# Patient Record
Sex: Female | Born: 1955 | Race: White | Hispanic: No | Smoking: Current every day smoker
Health system: Southern US, Community
[De-identification: ages and names within clinical notes are randomized; demographics above are authoritative.]

## PROBLEM LIST (undated history)

## (undated) DIAGNOSIS — E079 Disorder of thyroid, unspecified: Secondary | ICD-10-CM

## (undated) DIAGNOSIS — Z6833 Body mass index (BMI) 33.0-33.9, adult: Secondary | ICD-10-CM

## (undated) DIAGNOSIS — K219 Gastro-esophageal reflux disease without esophagitis: Secondary | ICD-10-CM

## (undated) DIAGNOSIS — I10 Essential (primary) hypertension: Secondary | ICD-10-CM

## (undated) DIAGNOSIS — E785 Hyperlipidemia, unspecified: Secondary | ICD-10-CM

## (undated) DIAGNOSIS — F419 Anxiety disorder, unspecified: Secondary | ICD-10-CM

## (undated) DIAGNOSIS — F17210 Nicotine dependence, cigarettes, uncomplicated: Secondary | ICD-10-CM

## (undated) DIAGNOSIS — I700 Atherosclerosis of aorta: Secondary | ICD-10-CM

## (undated) DIAGNOSIS — R41840 Attention and concentration deficit: Secondary | ICD-10-CM

## (undated) DIAGNOSIS — E039 Hypothyroidism, unspecified: Secondary | ICD-10-CM

## (undated) DIAGNOSIS — Z6834 Body mass index (BMI) 34.0-34.9, adult: Secondary | ICD-10-CM

## (undated) DIAGNOSIS — E119 Type 2 diabetes mellitus without complications: Secondary | ICD-10-CM

## (undated) DIAGNOSIS — E559 Vitamin D deficiency, unspecified: Secondary | ICD-10-CM

## (undated) DIAGNOSIS — R4181 Age-related cognitive decline: Secondary | ICD-10-CM

## (undated) HISTORY — DX: Body mass index (BMI) 33.0-33.9, adult: Z68.33

## (undated) HISTORY — DX: Essential (primary) hypertension: I10

## (undated) HISTORY — DX: Anxiety disorder, unspecified: F41.9

## (undated) HISTORY — DX: Age-related cognitive decline: R41.81

## (undated) HISTORY — DX: Nicotine dependence, cigarettes, uncomplicated: F17.210

## (undated) HISTORY — DX: Vitamin D deficiency, unspecified: E55.9

## (undated) HISTORY — DX: Atherosclerosis of aorta: I70.0

## (undated) HISTORY — DX: Attention and concentration deficit: R41.840

## (undated) HISTORY — PX: TUBAL LIGATION: SHX77

## (undated) HISTORY — DX: Gastro-esophageal reflux disease without esophagitis: K21.9

## (undated) HISTORY — DX: Hypothyroidism, unspecified: E03.9

## (undated) HISTORY — DX: Disorder of thyroid, unspecified: E07.9

## (undated) HISTORY — PX: CARPAL TUNNEL RELEASE: SHX101

## (undated) HISTORY — DX: Body mass index (BMI) 34.0-34.9, adult: Z68.34

---

## 2001-03-11 ENCOUNTER — Other Ambulatory Visit: Admission: RE | Admit: 2001-03-11 | Discharge: 2001-03-11 | Payer: Self-pay | Admitting: *Deleted

## 2002-01-06 ENCOUNTER — Encounter: Payer: Self-pay | Admitting: Gastroenterology

## 2002-01-06 ENCOUNTER — Ambulatory Visit (HOSPITAL_COMMUNITY): Admission: RE | Admit: 2002-01-06 | Discharge: 2002-01-06 | Payer: Self-pay | Admitting: Gastroenterology

## 2003-06-17 ENCOUNTER — Ambulatory Visit (HOSPITAL_BASED_OUTPATIENT_CLINIC_OR_DEPARTMENT_OTHER): Admission: RE | Admit: 2003-06-17 | Discharge: 2003-06-17 | Payer: Self-pay | Admitting: Orthopedic Surgery

## 2003-06-17 ENCOUNTER — Ambulatory Visit (HOSPITAL_COMMUNITY): Admission: RE | Admit: 2003-06-17 | Discharge: 2003-06-17 | Payer: Self-pay | Admitting: Orthopedic Surgery

## 2003-08-07 ENCOUNTER — Ambulatory Visit (HOSPITAL_BASED_OUTPATIENT_CLINIC_OR_DEPARTMENT_OTHER): Admission: RE | Admit: 2003-08-07 | Discharge: 2003-08-07 | Payer: Self-pay | Admitting: Orthopedic Surgery

## 2006-09-21 ENCOUNTER — Emergency Department (HOSPITAL_COMMUNITY): Admission: EM | Admit: 2006-09-21 | Discharge: 2006-09-22 | Payer: Self-pay | Admitting: Emergency Medicine

## 2006-10-26 ENCOUNTER — Emergency Department (HOSPITAL_COMMUNITY): Admission: EM | Admit: 2006-10-26 | Discharge: 2006-10-27 | Payer: Self-pay | Admitting: Emergency Medicine

## 2008-08-24 IMAGING — CR DG CHEST 1V PORT
1 series · 1 of 1 positions shown · non-contrast
Comparison: None

CLINICAL DATA: Shortness of breath

PORTABLE CHEST - 1 VIEW:

[view not recorded]
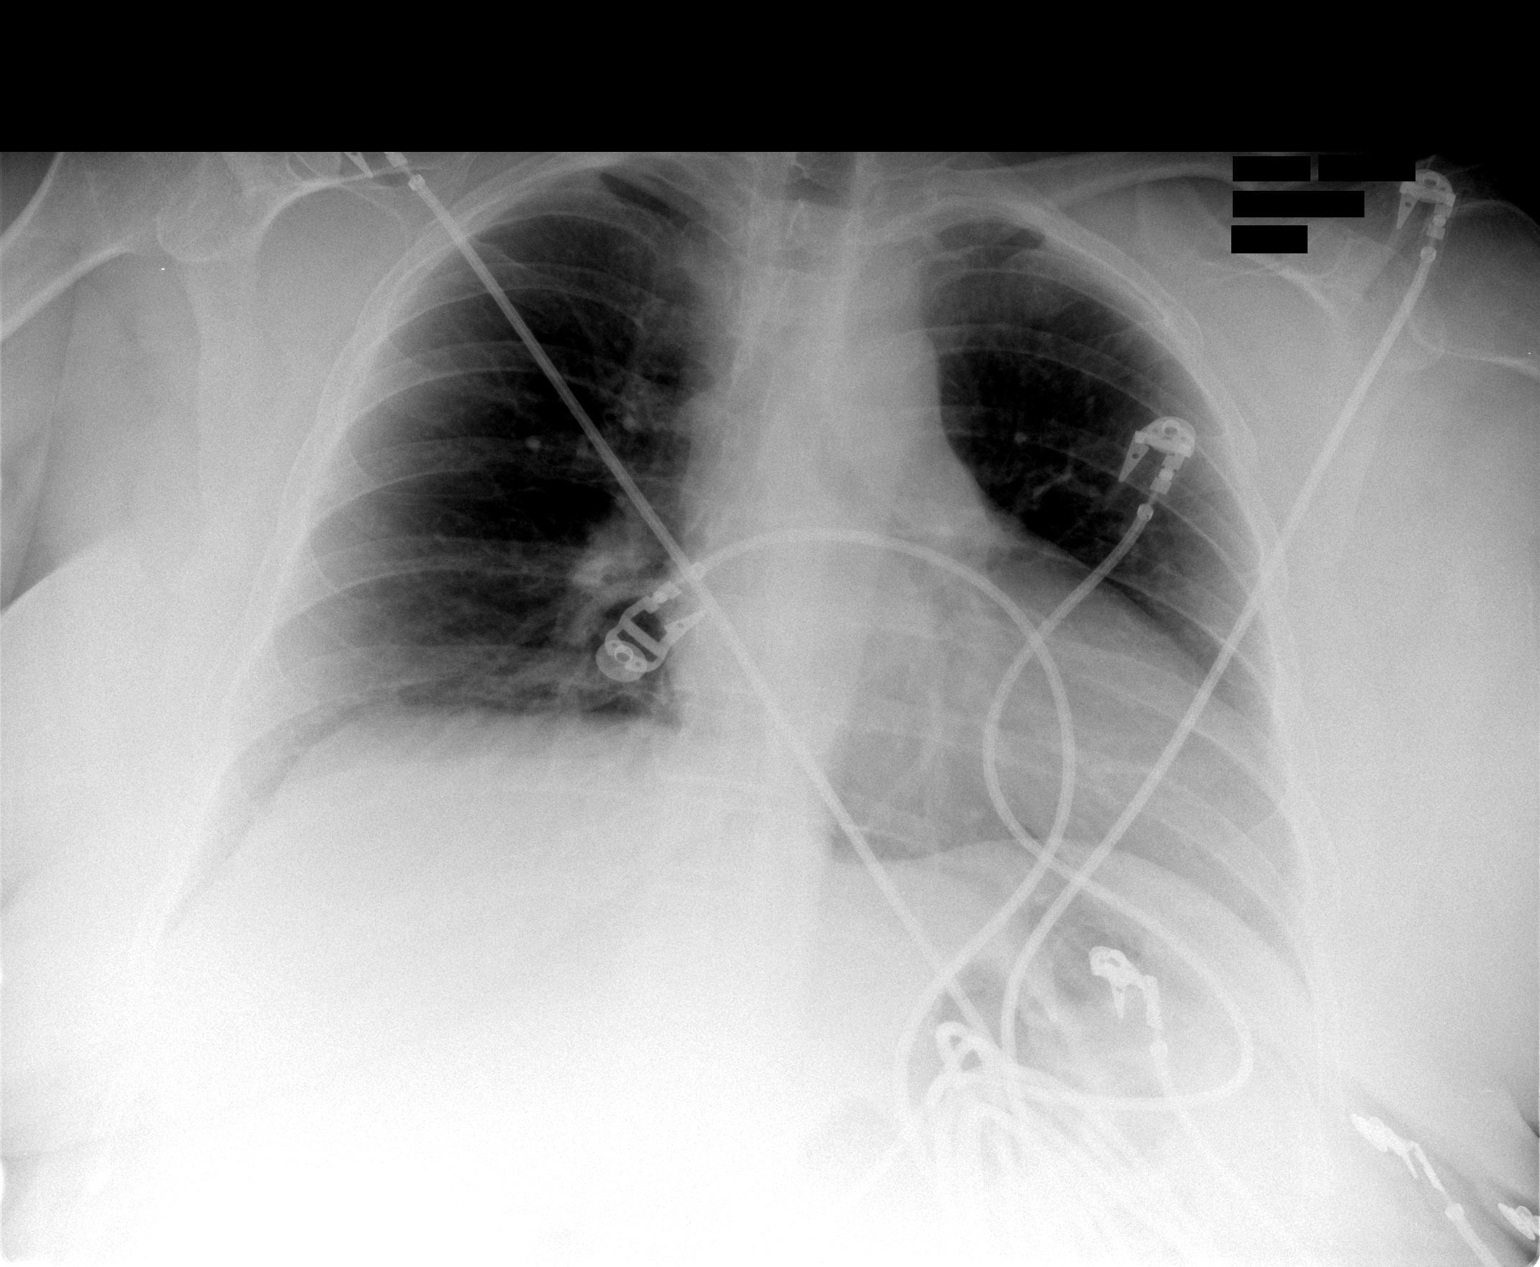

[1 of 1 positions shown; findings below may reference images not displayed]

FINDINGS: There are low lung volumes. Right base atelectasis present. Left lung
clear. Heart size is accentuated by the low volumes and portable study. No
effusions.
IMPRESSION: Low volumes, right base atelectasis.

## 2010-05-20 NOTE — Op Note (Signed)
NAMEKEALANI, LECKEY                             ACCOUNT NO.:  [id number]   MEDICAL RECORD NO.:  [id number]                   PATIENT TYPE:  AMB   LOCATION:  DSC                                  FACILITY:  [facility]   PHYSICIAN:  Cindee Salt, M.D.                    DATE OF BIRTH:  09-29-1955   DATE OF PROCEDURE:  DATE OF DISCHARGE:                                 OPERATIVE REPORT   Audio too short to transcribe (less than 5 seconds)                                               Cindee Salt, M.D.    GK/MEDQ  D:  06/17/2003  T:  06/17/2003  Job:  [id number]

## 2010-05-20 NOTE — Op Note (Signed)
NAMECELICA, [name]                             ACCOUNT NO.:  [id number]   MEDICAL RECORD NO.:  [id number]                   PATIENT TYPE:  AMB   LOCATION:  DSC                                  FACILITY:  [facility]   PHYSICIAN:  Cindee Salt, M.D.                    DATE OF BIRTH:  03-18-1955   DATE OF PROCEDURE:  06/17/2003  DATE OF DISCHARGE:                                 OPERATIVE REPORT   PREOPERATIVE DIAGNOSIS:  Carpal tunnel syndrome, left hand.   POSTOPERATIVE DIAGNOSIS:  Carpal tunnel syndrome, left hand.   OPERATION:  Decompression, left median nerve.   SURGEON:  Cindee Salt, M.D.   ASSISTANTCarolyne Fiscal.   ANESTHESIA:  Forearm-based IV regional.   HISTORY:  The patient is a 55 year old female with a history of carpal  tunnel syndrome, EMG nerve conductions positive, which has not responded to  conservative treatment.   PROCEDURE:  The patient was brought to the operating room, where a forearm-  based IV regional anesthetic was carried out without difficulty.  She was  prepped using Duraprep in supine position, left arm free.  The area was  infiltrated with 0.25% Marcaine without epinephrine and a straight incision  was made longitudinally in the palm and carried down through subcutaneous  tissue.  Bleeders were electrocauterized.  The palmar fascia was split,  superficial palmar arch identified, the flexor tendon to the right and  little finger identified to the ulnar side of the median nerve.  The carpal  retinaculum was incised with sharp dissection.  A right angle and Sewell  retractor were placed between skin and forearm fascia.  The fascia was  released for approximately a centimeter proximal to the wrist crease under  direct vision.  No further lesions were identified.  The wound was  irrigated.  Tenosynovial tissue was moderately thickened.  The skin was  closed with interrupted 5-0 nylon suture and a sterile compressive dressing  and splint to the hand was applied.   The patient tolerated the procedure  well and was taken to the recovery room for observation in satisfactory  condition.  She is discharged home to return to the [facility]  [place] in one week on Vicodin.                                               Cindee Salt, M.D.    GK/MEDQ  D:  06/17/2003  T:  06/17/2003  Job:  [id number]

## 2010-05-20 NOTE — Op Note (Signed)
[name], Alyssa [name]                             ACCOUNT NO.:  [id number]   MEDICAL RECORD NO.:  [id number]                   PATIENT TYPE:  AMB   LOCATION:  DSC                                  FACILITY:  [facility]   PHYSICIAN:  Cindee Salt, M.D.                    DATE OF BIRTH:  February 24, 1955   DATE OF PROCEDURE:  DATE OF DISCHARGE:                                 OPERATIVE REPORT   DATE OF OPERATION:  August 07, 2003.   PREOPERATIVE DIAGNOSIS:  Carpal tunnel syndrome, right hand.   POSTOPERATIVE DIAGNOSIS:  Carpal tunnel syndrome, right hand.   OPERATION:  Release, right carpal tunnel.   SURGEON:  Cindee Salt, MD.   Threasa HeadsCarolyne Fiscal.   ANESTHESIA:  Forearm based IV regional.   HISTORY:  The patient is a 55 year old female with a history of carpal  tunnel syndrome.  EMG nerve conduction is positive, which has not responded  to conservative treatment.  She has undergone release on her left side with  good results.  She is admitted now for release to her right side.   PROCEDURE:  The patient was brought to the operating room where a forearm  based IV regional anesthetic was carried out without difficulty.  She was  prepped using Duraprep.  In the supine position, right arm free, a  longitudinal incision was made in the palm and carried down through  subcutaneous tissues.  Bleeders were electrocauterized.  The palmar fascia  was split, the superficial palmar arch identified, the flexor tendon to the  ring and little finger identified.  To the ulnar side of the median nerve,  the carpal retinaculum was incised with sharp dissection and a right angle  and Sewell retractor were placed between skin and forearm fascia.  The  fascia was released for approximately 1.5 to 2 cm proximal to the wrist  crease under direct vision.  The canal was explored, tenosynovial tissue was  thickened, no further lesions were identified.  The wound was irrigated, and  the skin was closed with interrupted 5-0  nylon suture.  The sterile,  compressive dressing and splint were applied.  The patient tolerated the  procedure well and was taken to the recovery room for observation in  satisfactory condition.  She is discharged home to return to the [facility] in one week on Vicodin.                                               Cindee Salt, M.D.    Angelique Blonder  D:  08/07/2003  T:  08/08/2003  Job:  [id number]

## 2010-10-12 LAB — BASIC METABOLIC PANEL
BUN: 14
CO2: 25
Calcium: 9.6
Chloride: 104
Creatinine, Ser: 0.76
GFR calc Af Amer: >60
GFR calc non Af Amer: >60
Glucose, Bld: 121 — ABNORMAL HIGH
Potassium: 3.9
Sodium: 138

## 2010-10-12 LAB — DIFFERENTIAL
Basophils Absolute: 0.1
Basophils Relative: 1
Eosinophils Absolute: 0.3
Eosinophils Relative: 3
Lymphocytes Relative: 25
Lymphs Abs: 3
Monocytes Absolute: 1 — ABNORMAL HIGH
Monocytes Relative: 9
Neutro Abs: 7.7
Neutrophils Relative %: 63

## 2010-10-12 LAB — CBC
HCT: 39.1
Hemoglobin: 13.4
MCHC: 34.2
MCV: 91
Platelets: 546 — ABNORMAL HIGH
RBC: 4.3
RDW: 13.4
WBC: 12.1 — ABNORMAL HIGH

## 2010-10-12 LAB — D-DIMER, QUANTITATIVE: D-Dimer, Quant: 0.37

## 2010-10-13 LAB — I-STAT 8, (EC8 V) (CONVERTED LAB)
Acid-base deficit: 1
Bicarbonate: 23.2
Chloride: 105
HCT: 44
Operator id: 133351
pCO2, Ven: 34.9 — ABNORMAL LOW
pH, Ven: 7.431 — ABNORMAL HIGH

## 2010-10-13 LAB — POCT I-STAT CREATININE: Creatinine, Ser: 0.6

## 2010-10-13 LAB — POCT CARDIAC MARKERS
CKMB, poc: 2.8
Myoglobin, poc: 170
Troponin i, poc: <0.05

## 2011-03-15 ENCOUNTER — Other Ambulatory Visit (HOSPITAL_COMMUNITY): Payer: Self-pay | Admitting: Physician Assistant

## 2011-03-15 DIAGNOSIS — Z1231 Encounter for screening mammogram for malignant neoplasm of breast: Secondary | ICD-10-CM

## 2011-03-20 ENCOUNTER — Ambulatory Visit (HOSPITAL_COMMUNITY)
Admission: RE | Admit: 2011-03-20 | Discharge: 2011-03-20 | Disposition: A | Discharge status: Home / Self Care | Payer: Self-pay | Source: Ambulatory Visit | Attending: Physician Assistant | Admitting: Physician Assistant

## 2011-03-20 DIAGNOSIS — Z1231 Encounter for screening mammogram for malignant neoplasm of breast: Secondary | ICD-10-CM

## 2011-03-24 ENCOUNTER — Other Ambulatory Visit: Payer: Self-pay | Admitting: Physician Assistant

## 2011-03-24 DIAGNOSIS — R928 Other abnormal and inconclusive findings on diagnostic imaging of breast: Secondary | ICD-10-CM

## 2011-04-04 ENCOUNTER — Other Ambulatory Visit (HOSPITAL_COMMUNITY): Payer: Self-pay | Admitting: Family Medicine

## 2011-04-04 DIAGNOSIS — R928 Other abnormal and inconclusive findings on diagnostic imaging of breast: Secondary | ICD-10-CM

## 2011-04-12 ENCOUNTER — Other Ambulatory Visit (HOSPITAL_COMMUNITY): Payer: Self-pay | Admitting: Family Medicine

## 2011-04-12 ENCOUNTER — Ambulatory Visit (HOSPITAL_COMMUNITY)
Admission: RE | Admit: 2011-04-12 | Discharge: 2011-04-12 | Disposition: A | Discharge status: Home / Self Care | Payer: PRIVATE HEALTH INSURANCE | Source: Ambulatory Visit | Attending: Physician Assistant | Admitting: Physician Assistant

## 2011-04-12 ENCOUNTER — Ambulatory Visit (HOSPITAL_COMMUNITY)
Admission: RE | Admit: 2011-04-12 | Discharge: 2011-04-12 | Disposition: A | Discharge status: Home / Self Care | Payer: PRIVATE HEALTH INSURANCE | Source: Ambulatory Visit | Attending: Family Medicine | Admitting: Family Medicine

## 2011-04-12 DIAGNOSIS — R928 Other abnormal and inconclusive findings on diagnostic imaging of breast: Secondary | ICD-10-CM

## 2011-04-12 DIAGNOSIS — N6459 Other signs and symptoms in breast: Secondary | ICD-10-CM | POA: Insufficient documentation

## 2012-05-30 ENCOUNTER — Other Ambulatory Visit (HOSPITAL_COMMUNITY): Payer: Self-pay | Admitting: Physician Assistant

## 2012-05-30 DIAGNOSIS — Z139 Encounter for screening, unspecified: Secondary | ICD-10-CM

## 2012-07-30 ENCOUNTER — Other Ambulatory Visit (HOSPITAL_COMMUNITY): Payer: Self-pay | Admitting: Nurse Practitioner

## 2012-07-30 DIAGNOSIS — N6452 Nipple discharge: Secondary | ICD-10-CM

## 2012-08-07 ENCOUNTER — Other Ambulatory Visit (HOSPITAL_COMMUNITY): Payer: Self-pay | Admitting: Nurse Practitioner

## 2012-08-07 ENCOUNTER — Ambulatory Visit (HOSPITAL_COMMUNITY)
Admission: RE | Admit: 2012-08-07 | Discharge: 2012-08-07 | Disposition: A | Discharge status: Home / Self Care | Payer: PRIVATE HEALTH INSURANCE | Source: Ambulatory Visit | Attending: Nurse Practitioner | Admitting: Nurse Practitioner

## 2012-08-07 DIAGNOSIS — N6452 Nipple discharge: Secondary | ICD-10-CM

## 2012-08-07 DIAGNOSIS — Z1231 Encounter for screening mammogram for malignant neoplasm of breast: Secondary | ICD-10-CM | POA: Insufficient documentation

## 2013-12-11 ENCOUNTER — Encounter (HOSPITAL_COMMUNITY): Payer: Self-pay | Admitting: Emergency Medicine

## 2013-12-11 ENCOUNTER — Emergency Department (HOSPITAL_COMMUNITY): Payer: Self-pay

## 2013-12-11 ENCOUNTER — Emergency Department (HOSPITAL_COMMUNITY)
Admission: EM | Admit: 2013-12-11 | Discharge: 2013-12-11 | Disposition: A | Discharge status: Home / Self Care | Payer: Self-pay | Attending: Emergency Medicine | Admitting: Emergency Medicine

## 2013-12-11 DIAGNOSIS — K5732 Diverticulitis of large intestine without perforation or abscess without bleeding: Secondary | ICD-10-CM | POA: Insufficient documentation

## 2013-12-11 DIAGNOSIS — E119 Type 2 diabetes mellitus without complications: Secondary | ICD-10-CM | POA: Insufficient documentation

## 2013-12-11 DIAGNOSIS — Z720 Tobacco use: Secondary | ICD-10-CM | POA: Insufficient documentation

## 2013-12-11 DIAGNOSIS — Z79899 Other long term (current) drug therapy: Secondary | ICD-10-CM | POA: Insufficient documentation

## 2013-12-11 DIAGNOSIS — R102 Pelvic and perineal pain: Secondary | ICD-10-CM

## 2013-12-11 DIAGNOSIS — I10 Essential (primary) hypertension: Secondary | ICD-10-CM | POA: Insufficient documentation

## 2013-12-11 DIAGNOSIS — R1032 Left lower quadrant pain: Secondary | ICD-10-CM | POA: Insufficient documentation

## 2013-12-11 DIAGNOSIS — Z792 Long term (current) use of antibiotics: Secondary | ICD-10-CM | POA: Insufficient documentation

## 2013-12-11 DIAGNOSIS — R0602 Shortness of breath: Secondary | ICD-10-CM

## 2013-12-11 HISTORY — DX: Essential (primary) hypertension: I10

## 2013-12-11 HISTORY — DX: Type 2 diabetes mellitus without complications: E11.9

## 2013-12-11 LAB — COMPREHENSIVE METABOLIC PANEL
ALBUMIN: 3.4 g/dL — AB (ref 3.5–5.2)
ALK PHOS: 84 U/L (ref 39–117)
ALT: 25 U/L (ref 0–35)
AST: 22 U/L (ref 0–37)
Anion gap: 16 — ABNORMAL HIGH (ref 5–15)
BILIRUBIN TOTAL: 0.4 mg/dL (ref 0.3–1.2)
BUN: 13 mg/dL (ref 6–23)
CHLORIDE: 99 meq/L (ref 96–112)
CO2: 23 mEq/L (ref 19–32)
Calcium: 9.6 mg/dL (ref 8.4–10.5)
Creatinine, Ser: 0.82 mg/dL (ref 0.50–1.10)
GFR calc Af Amer: >90 mL/min (ref >90)
GFR calc non Af Amer: 78 mL/min — ABNORMAL LOW (ref >90)
Glucose, Bld: 97 mg/dL (ref 70–99)
POTASSIUM: 3.8 meq/L (ref 3.7–5.3)
Sodium: 138 mEq/L (ref 137–147)
Total Protein: 8 g/dL (ref 6.0–8.3)

## 2013-12-11 LAB — CBC WITH DIFFERENTIAL/PLATELET
BASOS ABS: 0 10*3/uL (ref 0.0–0.1)
BASOS PCT: 0 % (ref 0–1)
Eosinophils Absolute: 0.2 10*3/uL (ref 0.0–0.7)
Eosinophils Relative: 2 % (ref 0–5)
HCT: 34.3 % — ABNORMAL LOW (ref 36.0–46.0)
HEMOGLOBIN: 11.7 g/dL — AB (ref 12.0–15.0)
Lymphocytes Relative: 21 % (ref 12–46)
Lymphs Abs: 2.3 10*3/uL (ref 0.7–4.0)
MCH: 32.3 pg (ref 26.0–34.0)
MCHC: 34.1 g/dL (ref 30.0–36.0)
MCV: 94.8 fL (ref 78.0–100.0)
MONOS PCT: 9 % (ref 3–12)
Monocytes Absolute: 1 10*3/uL (ref 0.1–1.0)
NEUTROS ABS: 7.4 10*3/uL (ref 1.7–7.7)
NEUTROS PCT: 68 % (ref 43–77)
Platelets: 383 10*3/uL (ref 150–400)
RBC: 3.62 MIL/uL — ABNORMAL LOW (ref 3.87–5.11)
RDW: 12.1 % (ref 11.5–15.5)
WBC: 10.9 10*3/uL — ABNORMAL HIGH (ref 4.0–10.5)

## 2013-12-11 LAB — URINALYSIS, ROUTINE W REFLEX MICROSCOPIC
Bilirubin Urine: NEGATIVE
GLUCOSE, UA: NEGATIVE mg/dL
HGB URINE DIPSTICK: NEGATIVE
Ketones, ur: NEGATIVE mg/dL
Leukocytes, UA: NEGATIVE
Nitrite: NEGATIVE
Protein, ur: NEGATIVE mg/dL
SPECIFIC GRAVITY, URINE: 1.015 (ref 1.005–1.030)
Urobilinogen, UA: 0.2 mg/dL (ref 0.0–1.0)
pH: 8 (ref 5.0–8.0)

## 2013-12-11 LAB — TROPONIN I: Troponin I: <0.3 ng/mL (ref <0.30)

## 2013-12-11 LAB — LIPASE, BLOOD: Lipase: 36 U/L (ref 11–59)

## 2013-12-11 MED ORDER — METRONIDAZOLE IN NACL 5-0.79 MG/ML-% IV SOLN
500.0000 mg | Freq: Once | INTRAVENOUS | Status: AC
  Administered 2013-12-11: 500 mg via INTRAVENOUS
  Filled 2013-12-11: qty 100

## 2013-12-11 MED ORDER — IOHEXOL 300 MG/ML  SOLN
100.0000 mL | Freq: Once | INTRAMUSCULAR | Status: AC | PRN
  Administered 2013-12-11: 100 mL via INTRAVENOUS

## 2013-12-11 MED ORDER — CIPROFLOXACIN HCL 500 MG PO TABS
500.0000 mg | ORAL_TABLET | Freq: Two times a day (BID) | ORAL | Status: DC
Start: 2013-12-11 — End: 2014-07-08

## 2013-12-11 MED ORDER — METRONIDAZOLE 500 MG PO TABS: 500.0000 mg | ORAL_TABLET | Freq: Three times a day (TID) | ORAL | Status: DC

## 2013-12-11 MED ORDER — PROMETHAZINE HCL 25 MG PO TABS: 25.0000 mg | ORAL_TABLET | Freq: Four times a day (QID) | ORAL | Status: DC | PRN

## 2013-12-11 MED ORDER — IOHEXOL 300 MG/ML  SOLN
50.0000 mL | Freq: Once | INTRAMUSCULAR | Status: AC | PRN
  Administered 2013-12-11: 50 mL via ORAL

## 2013-12-11 MED ORDER — ONDANSETRON HCL 4 MG/2ML IJ SOLN
4.0000 mg | Freq: Once | INTRAMUSCULAR | Status: AC
  Administered 2013-12-11: 4 mg via INTRAVENOUS
  Filled 2013-12-11: qty 2

## 2013-12-11 MED ORDER — MORPHINE SULFATE 4 MG/ML IJ SOLN
4.0000 mg | Freq: Once | INTRAMUSCULAR | Status: AC
  Administered 2013-12-11: 4 mg via INTRAVENOUS
  Filled 2013-12-11: qty 1

## 2013-12-11 MED ORDER — HYDROCODONE-ACETAMINOPHEN 5-325 MG PO TABS: 2.0000 | ORAL_TABLET | ORAL | Status: DC | PRN

## 2013-12-11 MED ORDER — SODIUM CHLORIDE 0.9 % IV BOLUS (SEPSIS)
1000.0000 mL | Freq: Once | INTRAVENOUS | Status: AC
  Administered 2013-12-11: 1000 mL via INTRAVENOUS

## 2013-12-11 MED ORDER — CIPROFLOXACIN IN D5W 400 MG/200ML IV SOLN
400.0000 mg | Freq: Once | INTRAVENOUS | Status: AC
  Administered 2013-12-11: 400 mg via INTRAVENOUS
  Filled 2013-12-11: qty 200

## 2013-12-11 NOTE — Discharge Instructions (Signed)
You have diverticulitis of the lower intestine called the sigmoid area. Prescriptions for 2 antibiotics, pain medicine, nausea medicine. Increase fluids. Return if worse

## 2013-12-11 NOTE — ED Notes (Signed)
Pt c/o left side abd pain x 4 days with sob today. Pt brought to ED by EMS for free clinic.

## 2013-12-11 NOTE — Care Management Note (Signed)
ED/CM noted patient did not have health insurance and/or PCP listed in the computer.  Patient was given the [place] resource handout with information on the clinics, food pantries, and the handout for new health insurance sign-up. Pt was also given a Rx discount card. Patient expressed appreciation for information received. 

## 2013-12-13 NOTE — ED Provider Notes (Signed)
CSN: [id number]     Arrival date & time 12/11/13  1108 History   First MD Initiated Contact with Patient 12/11/13 1215     Chief Complaint  Patient presents with  . Shortness of Breath     (Consider location/radiation/quality/duration/timing/severity/associated sxs/prior Treatment) HPI.... Left-sided abdominal pain for 4 days. Patient is able to eat. No fevers, sweats, chills, diarrhea, dysuria.. No radiation of pain. This pain is unusual for patient. Past medical history includes diabetes mellitus hypertension. Palpation makes pain worse.  Past Medical History  Diagnosis Date  . Diabetes mellitus without complication   . Hypertension    Past Surgical History  Procedure Laterality Date  . Carpal tunnel release     No family history on file. History  Substance Use Topics  . Smoking status: Current Every Day Smoker -- 0.50 packs/day    Types: Cigarettes  . Smokeless tobacco: Not on file  . Alcohol Use: No   OB History    No data available     Review of Systems  All other systems reviewed and are negative.     Allergies  Sulfa antibiotics  Home Medications   Prior to Admission medications   Medication Sig Start Date End Date Taking? Authorizing Provider  levothyroxine (SYNTHROID, LEVOTHROID) 125 MCG tablet Take 125 mcg by mouth daily before breakfast.   Yes Historical Provider, MD  lisinopril-hydrochlorothiazide (PRINZIDE,ZESTORETIC) 20-25 MG per tablet Take 1 tablet by mouth daily.   Yes Historical Provider, MD  metFORMIN (GLUCOPHAGE) 500 MG tablet Take 250 mg by mouth daily with breakfast.   Yes Historical Provider, MD  pantoprazole (PROTONIX) 40 MG tablet Take 40 mg by mouth daily.   Yes Historical Provider, MD  ciprofloxacin (CIPRO) 500 MG tablet Take 1 tablet (500 mg total) by mouth 2 (two) times daily. 12/11/13   Donnetta HutchingBrian Baeleigh Devincent, MD  HYDROcodone-acetaminophen (NORCO) 5-325 MG per tablet Take 2 tablets by mouth every 4 (four) hours as needed. 12/11/13   Donnetta HutchingBrian Mikaylah Libbey, MD   metroNIDAZOLE (FLAGYL) 500 MG tablet Take 1 tablet (500 mg total) by mouth 3 (three) times daily. 12/11/13   Donnetta HutchingBrian Odyssey Vasbinder, MD  promethazine (PHENERGAN) 25 MG tablet Take 1 tablet (25 mg total) by mouth every 6 (six) hours as needed. 12/11/13   Donnetta HutchingBrian Giavonna Pflum, MD   BP 97/55 mmHg  Pulse 71  Temp(Src) 97.6 F (36.4 C) (Oral)  Resp 16  Ht 5\' 3"  (1.6 m)  Wt 219 lb (99.338 kg)  BMI 38.80 kg/m2  SpO2 98% Physical Exam  Constitutional: She is oriented to person, place, and time. She appears well-developed and well-nourished.  HENT:  Head: Normocephalic and atraumatic.  Eyes: Conjunctivae and EOM are normal. Pupils are equal, round, and reactive to light.  Neck: Normal range of motion. Neck supple.  Cardiovascular: Normal rate, regular rhythm and normal heart sounds.   Pulmonary/Chest: Effort normal and breath sounds normal.  Abdominal: Soft. Bowel sounds are normal.  Tender left lower quadrant.  Musculoskeletal: Normal range of motion.  Neurological: She is alert and oriented to person, place, and time.  Skin: Skin is warm and dry.  Psychiatric: She has a normal mood and affect. Her behavior is normal.  Nursing note and vitals reviewed.   ED Course  Procedures (including critical care time) Labs Review Labs Reviewed  CBC WITH DIFFERENTIAL - Abnormal; Notable for the following:    WBC 10.9 (*)    RBC 3.62 (*)    Hemoglobin 11.7 (*)    HCT 34.3 (*)  All other components within normal limits  COMPREHENSIVE METABOLIC PANEL - Abnormal; Notable for the following:    Albumin 3.4 (*)    GFR calc non Af Amer 78 (*)    Anion gap 16 (*)    All other components within normal limits  TROPONIN I  LIPASE, BLOOD  URINALYSIS, ROUTINE W REFLEX MICROSCOPIC    Imaging Review Ct Abdomen Pelvis W Contrast  12/11/2013   CLINICAL DATA:  Suprapubic and left-sided abdomen pain for 4 days.  EXAM: CT ABDOMEN AND PELVIS WITH CONTRAST  TECHNIQUE: Multidetector CT imaging of the abdomen and pelvis was  performed using the standard protocol following bolus administration of intravenous contrast.  CONTRAST:  100mL OMNIPAQUE IOHEXOL 300 MG/ML SOLN, 50mL OMNIPAQUE IOHEXOL 300 MG/ML SOLN  COMPARISON:  None.  FINDINGS: The liver, spleen, pancreas and adrenal glands are normal. There is gallstone in the gallbladder. There is no pericolonic inflammatory change. There is a 3.1 x 3.3 cm simple cyst in the medial lower pole right kidney. The kidneys are otherwise normal. There is no hydronephrosis bilaterally. There is atherosclerosis of the abdominal aorta without aneurysmal dilatation. There is no abdominal lymphadenopathy.  There is bowel wall thickening with surrounding inflammation surrounding the sigmoid colon. There is no focal abscess or free air. There is no colonic or small bowel obstruction. The appendix is not seen but no inflammation surrounds the cecum.  Images of the pelvis demonstrate fluid-filled bladder without abnormality. The uterus is normal. Pelvic phleboliths are identified. The visualized lung bases are clear. Degenerative joint changes of the spine are noted.  IMPRESSION: Findings consistent with sigmoid diverticulitis. No focal discrete abscess is identified.   Electronically Signed   By: Sherian ReinWei-Chen  Lin M.D.   On: 12/11/2013 16:16     EKG Interpretation None      MDM   Final diagnoses:  SOB (shortness of breath)  Suprapubic pain  Sigmoid diverticulitis    CT abdomen/pelvis suggests sigmoid diverticulitis. IV Flagyl, IV Cipro. Patient is hemodynamically stable. I believe she can be treated as an outpatient. Discharge medications include Flagyl 500 mg, Cipro 500 mg, Vicodin, Phenergan 25 mg. She'll return if worse.    Donnetta HutchingBrian Kayte Borchard, MD 12/13/13 1150

## 2014-01-12 ENCOUNTER — Other Ambulatory Visit (HOSPITAL_COMMUNITY): Payer: Self-pay | Admitting: Physician Assistant

## 2014-01-12 DIAGNOSIS — Z1231 Encounter for screening mammogram for malignant neoplasm of breast: Secondary | ICD-10-CM

## 2014-01-28 ENCOUNTER — Ambulatory Visit (HOSPITAL_COMMUNITY): Payer: Self-pay

## 2014-01-29 ENCOUNTER — Ambulatory Visit (HOSPITAL_COMMUNITY)
Admission: RE | Admit: 2014-01-29 | Discharge: 2014-01-29 | Disposition: A | Discharge status: Home / Self Care | Payer: Self-pay | Source: Ambulatory Visit | Attending: Physician Assistant | Admitting: Physician Assistant

## 2014-01-29 DIAGNOSIS — Z1231 Encounter for screening mammogram for malignant neoplasm of breast: Secondary | ICD-10-CM

## 2014-01-30 ENCOUNTER — Other Ambulatory Visit: Payer: Self-pay | Admitting: Physician Assistant

## 2014-01-30 DIAGNOSIS — R928 Other abnormal and inconclusive findings on diagnostic imaging of breast: Secondary | ICD-10-CM

## 2014-02-17 ENCOUNTER — Encounter (HOSPITAL_COMMUNITY): Payer: Self-pay

## 2014-04-14 ENCOUNTER — Telehealth (HOSPITAL_COMMUNITY): Payer: Self-pay | Admitting: *Deleted

## 2014-04-14 NOTE — Telephone Encounter (Signed)
Telephoned patient at home # and is disconnected.

## 2014-04-20 ENCOUNTER — Other Ambulatory Visit (HOSPITAL_COMMUNITY): Payer: Self-pay | Admitting: *Deleted

## 2014-04-20 DIAGNOSIS — R928 Other abnormal and inconclusive findings on diagnostic imaging of breast: Secondary | ICD-10-CM

## 2014-05-19 ENCOUNTER — Ambulatory Visit (HOSPITAL_COMMUNITY)
Admission: RE | Admit: 2014-05-19 | Discharge: 2014-05-19 | Disposition: A | Discharge status: Home / Self Care | Payer: Self-pay | Source: Ambulatory Visit | Attending: Obstetrics and Gynecology | Admitting: Obstetrics and Gynecology

## 2014-05-19 ENCOUNTER — Encounter (HOSPITAL_COMMUNITY): Payer: Self-pay

## 2014-05-19 VITALS — BP 124/76 | Temp 97.9°F | Ht 63.0 in | Wt 224.6 lb

## 2014-05-19 DIAGNOSIS — R928 Other abnormal and inconclusive findings on diagnostic imaging of breast: Secondary | ICD-10-CM

## 2014-05-19 DIAGNOSIS — Z01419 Encounter for gynecological examination (general) (routine) without abnormal findings: Secondary | ICD-10-CM

## 2014-05-19 HISTORY — DX: Hyperlipidemia, unspecified: E78.5

## 2014-05-19 NOTE — Patient Instructions (Signed)
Educational materials on self breast awareness given. Explained to Alyssa [name] that based on her last Pap smear being abnormal that she will need a Pap smear in 1 year if today's Pap smear is normal. Referred patient to [facility] for a left breast diagnostic mammogram per recommendation. Appointment scheduled following [facility] appointment. Patient aware of appointment and will be there. Let patient know will follow up with her within the next couple weeks with results of Pap smear by phone. Smoking cessation discussed with patient and resources given. Referred patient to the [facility]. Alyssa [name] verbalized understanding. Patient escorted to [facility].  [place], Kathaleen Maserhristine Poll, RN 3:16 PM

## 2014-05-19 NOTE — Progress Notes (Signed)
[facility] needing additional imaging of left breast. Screening mammogram completed [facility]/29/2014 at [facility]. Patient complained today of a left breast abscess x 1 week.  Pap Smear:  Pap smear completed today. Patients [facility] Patient was referred to Dr. Emelda FearFerguson and patient cannot remember what follow-up was completed. Per patient all of her other Pap smears have been normal. No Pap smear results in EPIC.  Physical exam: Breasts Breasts symmetrical. No skin abnormalities right breast. Abscess observed on left inner breast that per patient has been there x 1 week. Palpated firmness and observed bloody appearing draining from abscess. No nipple retraction bilateral breasts. No nipple discharge bilateral breasts. No lymphadenopathy. No lumps palpated bilateral breasts. No complaints of pain or tenderness on exam. [facility] for a left breast diagnostic mammogram per recommendation. Appointment scheduled following [facility] appointment.         Pelvic/Bimanual   Ext Genitalia No lesions, no swelling and no discharge observed on external genitalia.         Vagina Vagina pink and normal texture. No lesions or discharge observed in vagina.          Cervix Cervix is present. Cervix pink and of normal texture. Cervix friable. No discharge observed.     Uterus Uterus is present and palpable. Uterus in normal position and normal size.        Adnexae Bilateral ovaries present and palpable. No tenderness on palpation.          Rectovaginal No rectal exam completed today since patient had no rectal complaints. No skin abnormalities observed on exam.      Smoking cessation discussed with patient and resources given. Referred patient to the [facility].

## 2014-05-25 LAB — CYTOLOGY - PAP

## 2014-07-07 ENCOUNTER — Encounter (HOSPITAL_COMMUNITY): Payer: Self-pay | Admitting: Emergency Medicine

## 2014-07-07 ENCOUNTER — Emergency Department (HOSPITAL_COMMUNITY): Payer: Self-pay

## 2014-07-07 ENCOUNTER — Emergency Department (HOSPITAL_COMMUNITY)
Admission: EM | Admit: 2014-07-07 | Discharge: 2014-07-08 | Disposition: A | Discharge status: Home / Self Care | Payer: Self-pay | Attending: Emergency Medicine | Admitting: Emergency Medicine

## 2014-07-07 DIAGNOSIS — I10 Essential (primary) hypertension: Secondary | ICD-10-CM | POA: Insufficient documentation

## 2014-07-07 DIAGNOSIS — K5900 Constipation, unspecified: Secondary | ICD-10-CM | POA: Insufficient documentation

## 2014-07-07 DIAGNOSIS — Z720 Tobacco use: Secondary | ICD-10-CM | POA: Insufficient documentation

## 2014-07-07 DIAGNOSIS — Z79899 Other long term (current) drug therapy: Secondary | ICD-10-CM | POA: Insufficient documentation

## 2014-07-07 DIAGNOSIS — Z8639 Personal history of other endocrine, nutritional and metabolic disease: Secondary | ICD-10-CM | POA: Insufficient documentation

## 2014-07-07 DIAGNOSIS — K5732 Diverticulitis of large intestine without perforation or abscess without bleeding: Secondary | ICD-10-CM | POA: Insufficient documentation

## 2014-07-07 MED ORDER — ONDANSETRON HCL 4 MG/2ML IJ SOLN
4.0000 mg | Freq: Once | INTRAMUSCULAR | Status: AC
Start: 2014-07-07 — End: 2014-07-08
  Administered 2014-07-08: 4 mg via INTRAVENOUS
  Filled 2014-07-07: qty 2

## 2014-07-07 MED ORDER — FENTANYL CITRATE (PF) 100 MCG/2ML IJ SOLN
50.0000 ug | Freq: Once | INTRAMUSCULAR | Status: AC
Start: 2014-07-07 — End: 2014-07-08
  Administered 2014-07-08: 50 ug via INTRAVENOUS
  Filled 2014-07-07: qty 2

## 2014-07-07 MED ORDER — IOHEXOL 300 MG/ML  SOLN
50.0000 mL | Freq: Once | INTRAMUSCULAR | Status: AC | PRN
  Administered 2014-07-07: 50 mL via ORAL

## 2014-07-07 MED ORDER — IOHEXOL 300 MG/ML  SOLN: 100.0000 mL | Freq: Once | INTRAMUSCULAR | Status: AC | PRN

## 2014-07-07 MED ORDER — SODIUM CHLORIDE 0.9 % IV BOLUS (SEPSIS)
1000.0000 mL | Freq: Once | INTRAVENOUS | Status: AC
  Administered 2014-07-08: 1000 mL via INTRAVENOUS

## 2014-07-07 NOTE — ED Notes (Signed)
Pt c/o lower abd pain with nausea.

## 2014-07-07 NOTE — ED Notes (Signed)
Dr. Lynelle DoctorKnapp in room assessing patient at this time.

## 2014-07-07 NOTE — ED Provider Notes (Signed)
CSN: [id number]     Arrival date & time 07/07/14  2254 History  This chart was scribed for Devoria Albe, MD by Marica Otter, ED Scribe. This patient was seen in room APA03/APA03 and the patient's care was started at 11:30 PM.   Chief Complaint  Patient presents with  . Abdominal Pain   H[facility]: [facility] [place]  HPI Comments: Alyssa [name] is a 59 y.o. female,  with PMHx noted below including diverticulitis, DM (pt reports no meds for DM due to good control of glucose levels), HTN and daily tobacco use (1 ppd), who presents to the Emergency Department complaining of lower abd pain with associated nausea onset last night. Pt notes present pain is consistent with diverticulitis pain she had once before. Pt reports walking and coughing makes the pain worse and nothing improves the pain. Pt also complains that she has been constipated for the past couple of weeks.  States her last BM was 2 weeks ago. Pt denies diarrhea, vomiting, fever, urinary Sx including dysuria, frequency, appetite change, EtOH use. Pt reports her daily meds include: synthroid amd lisinopril/HCTZ. She states she has been following the suggested diet for diverticula such as avoiding nuts.   [facility]  Past Medical History  Diagnosis Date  . Hypertension   . Hyperlipidemia    Past Surgical History  Procedure Laterality Date  . Carpal tunnel release     History reviewed. No pertinent family history. History  Substance Use Topics  . Smoking status: Current Every Day Smoker -- 0.50 packs/day    Types: Cigarettes  . Smokeless tobacco: Not on file  . Alcohol Use: No  employed Smokes 1 ppd   OB History    Gravida Para Term Preterm AB TAB SAB Ectopic Multiple Living   2 2 2       2      Review of Systems  Constitutional: Negative for fever, chills and appetite change.  Gastrointestinal: Positive for nausea, abdominal pain and constipation. Negative for vomiting and diarrhea.  Genitourinary: Negative for  dysuria, frequency, decreased urine volume and difficulty urinating.  All other systems reviewed and are negative.  Allergies  Sulfa antibiotics  Home Medications   Prior to Admission medications   Medication Sig Start Date End Date Taking? Authorizing Provider  levothyroxine (SYNTHROID, LEVOTHROID) 125 MCG tablet Take 125 mcg by mouth daily before breakfast.   Yes Historical Provider, MD  lisinopril-hydrochlorothiazide (PRINZIDE,ZESTORETIC) 20-25 MG per tablet Take 1 tablet by mouth daily.   Yes Historical Provider, MD  metFORMIN (GLUCOPHAGE) 500 MG tablet Take 250 mg by mouth daily with breakfast.   Yes Historical Provider, MD  pantoprazole (PROTONIX) 40 MG tablet Take 40 mg by mouth daily.   Yes Historical Provider, MD  ciprofloxacin (CIPRO) 500 MG tablet Take 1 tablet (500 mg total) by mouth 2 (two) times daily. 07/08/14   Devoria Albe, MD  HYDROcodone-acetaminophen (NORCO) 5-325 MG per tablet Take 2 tablets by mouth every 4 (four) hours as needed. Patient not taking: Reported on 05/19/2014 12/11/13   Donnetta Hutching, MD  metroNIDAZOLE (FLAGYL) 500 MG tablet Take 1 tablet (500 mg total) by mouth 4 (four) times daily. 07/08/14   Devoria Albe, MD  ondansetron (ZOFRAN) 4 MG tablet Take 1 tablet (4 mg total) by mouth every 8 (eight) hours as needed for nausea or vomiting. 07/08/14   Devoria Albe, MD  oxyCODONE-acetaminophen (PERCOCET/ROXICET) 5-325 MG per tablet Take 1 tablet by mouth every 4 (four) hours as needed for  moderate pain or severe pain. 07/08/14   Devoria Albe, MD  promethazine (PHENERGAN) 25 MG tablet Take 1 tablet (25 mg total) by mouth every 6 (six) hours as needed. Patient not taking: Reported on 05/19/2014 12/11/13   Donnetta Hutching, MD   Triage Vitals: BP 153/88 mmHg  Pulse 87  Temp(Src) 97.6 F (36.4 C) (Oral)  Resp 24  Ht  (1.626 m)  Wt 224 lb (101.606 kg)  BMI 38.43 kg/m2  SpO2 99%  Vital signs normal   Physical Exam  Constitutional: She is oriented to person, place, and time. She  appears well-developed and well-nourished.  Non-toxic appearance. She does not appear ill. No distress.  HENT:  Head: Normocephalic and atraumatic.  Right Ear: External ear normal.  Left Ear: External ear normal.  Nose: Nose normal. No mucosal edema or rhinorrhea.  Mouth/Throat: Oropharynx is clear and moist and mucous membranes are normal. No dental abscesses or uvula swelling.  Eyes: Conjunctivae and EOM are normal. Pupils are equal, round, and reactive to light.  Neck: Normal range of motion and full passive range of motion without pain. Neck supple.  Cardiovascular: Normal rate, regular rhythm and normal heart sounds.  Exam reveals no gallop and no friction rub.   No murmur heard. Pulmonary/Chest: Effort normal and breath sounds normal. No respiratory distress. She has no wheezes. She has no rhonchi. She has no rales. She exhibits no tenderness and no crepitus.  Abdominal: Soft. Normal appearance and bowel sounds are normal. She exhibits no distension. There is tenderness in the left lower quadrant. There is no rebound and no guarding.    Significant LLQ tenderness.   Musculoskeletal: Normal range of motion. She exhibits no edema or tenderness.  Moves all extremities well.   Neurological: She is alert and oriented to person, place, and time. She has normal strength. No cranial nerve deficit.  Skin: Skin is warm, dry and intact. No rash noted. No erythema. No pallor.  Psychiatric: She has a normal mood and affect. Her speech is normal and behavior is normal. Her mood appears not anxious.  Nursing note and vitals reviewed.   ED Course  Procedures (including critical care time)  Medications  iohexol (OMNIPAQUE) 300 MG/ML solution 100 mL (not administered)  sodium chloride 0.9 % bolus 1,000 mL (0 mLs Intravenous Stopped 07/08/14 0113)  fentaNYL (SUBLIMAZE) injection 50 mcg (50 mcg Intravenous Given 07/08/14 0003)  ondansetron (ZOFRAN) injection 4 mg (4 mg Intravenous Given 07/08/14 0003)   iohexol (OMNIPAQUE) 300 MG/ML solution 50 mL (50 mLs Oral Contrast Given 07/07/14 2346)  iohexol (OMNIPAQUE) 300 MG/ML solution 100 mL (100 mLs Intravenous Contrast Given 07/08/14 0053)  metroNIDAZOLE (FLAGYL) IVPB 500 mg (0 mg Intravenous Stopped 07/08/14 0420)  ciprofloxacin (CIPRO) IVPB 400 mg (0 mg Intravenous Stopped 07/08/14 0317)  oxyCODONE-acetaminophen (PERCOCET/ROXICET) 5-325 MG per tablet 1 tablet (1 tablet Oral Given 07/08/14 0337)    DIAGNOSTIC STUDIES: Oxygen Saturation is 99% on RA, nl by my interpretation.    COORDINATION OF CARE: 11:36 PM-Discussed treatment plan which includes imaging with pt at bedside and pt agreed to plan.   I discussed patient's test results and her CT results. After consideration patient has decided she wants to try going home and taking oral anti-biotic some pain medication. She was given precautions to return to the ED such as fever or worsening pain. Referral to Dr. Jena Gauss gastroenterologist.  Labs Review Results for orders placed or performed during the hospital encounter of 07/07/14  Comprehensive metabolic panel  Result  Value Ref Range   Sodium 136 135 - 145 mmol/L   Potassium 3.5 3.5 - 5.1 mmol/L   Chloride 102 101 - 111 mmol/L   CO2 25 22 - 32 mmol/L   Glucose, Bld 109 (H) 65 - 99 mg/dL   BUN 17 6 - 20 mg/dL   Creatinine, Ser 1.61 0.44 - 1.00 mg/dL   Calcium 9.3 8.9 - 09.6 mg/dL   Total Protein 7.9 6.5 - 8.1 g/dL   Albumin 4.1 3.5 - 5.0 g/dL   AST 17 15 - 41 U/L   ALT 18 14 - 54 U/L   Alkaline Phosphatase 74 38 - 126 U/L   Total Bilirubin 0.4 0.3 - 1.2 mg/dL   GFR calc non Af Amer >60 >60 mL/min   GFR calc Af Amer >60 >60 mL/min   Anion gap 9 5 - 15  CBC with Differential  Result Value Ref Range   WBC 12.2 (H) 4.0 - 10.5 K/uL   RBC 4.36 3.87 - 5.11 MIL/uL   Hemoglobin 13.8 12.0 - 15.0 g/dL   HCT 04.5 40.9 - 81.1 %   MCV 94.7 78.0 - 100.0 fL   MCH 31.7 26.0 - 34.0 pg   MCHC 33.4 30.0 - 36.0 g/dL   RDW 91.4 78.2 - 95.6 %   Platelets  376 150 - 400 K/uL   Neutrophils Relative % 63 43 - 77 %   Neutro Abs 7.8 (H) 1.7 - 7.7 K/uL   Lymphocytes Relative 27 12 - 46 %   Lymphs Abs 3.3 0.7 - 4.0 K/uL   Monocytes Relative 8 3 - 12 %   Monocytes Absolute 0.9 0.1 - 1.0 K/uL   Eosinophils Relative 2 0 - 5 %   Eosinophils Absolute 0.3 0.0 - 0.7 K/uL   Basophils Relative 0 0 - 1 %   Basophils Absolute 0.0 0.0 - 0.1 K/uL  Urinalysis, Routine w reflex microscopic (not at [facility])  Result Value Ref Range   Color, Urine STRAW (A) YELLOW   APPearance CLEAR CLEAR   Specific Gravity, Urine <1.005 (L) 1.005 - 1.030   pH 5.5 5.0 - 8.0   Glucose, UA NEGATIVE NEGATIVE mg/dL   Hgb urine dipstick NEGATIVE NEGATIVE   Bilirubin Urine NEGATIVE NEGATIVE   Ketones, ur NEGATIVE NEGATIVE mg/dL   Protein, ur NEGATIVE NEGATIVE mg/dL   Urobilinogen, UA 0.2 0.0 - 1.0 mg/dL   Nitrite NEGATIVE NEGATIVE   Leukocytes, UA NEGATIVE NEGATIVE  Lipase, blood  Result Value Ref Range   Lipase 29 22 - 51 U/L   Laboratory interpretation all normal except mild leukocytosis     Imaging Review Ct Abdomen Pelvis W Contrast  07/08/2014   CLINICAL DATA:  Acute onset of lower abdominal pain and nausea. Subacute onset of constipation. Initial encounter.  EXAM: CT ABDOMEN AND PELVIS WITH CONTRAST  TECHNIQUE: Multidetector CT imaging of the abdomen and pelvis was performed using the standard protocol following bolus administration of intravenous contrast.  CONTRAST:  OMNIPAQUE IOHEXOL 300 MG/ML  SOLN  COMPARISON:  CT of the abdomen and pelvis performed 12/11/2013  FINDINGS: The visualized lung bases are clear. Scattered coronary artery calcifications are seen.  The liver and spleen are unremarkable in appearance. A 4 cm stone is noted within the gallbladder. The gallbladder is decompressed and otherwise unremarkable. The pancreas and adrenal glands are unremarkable.  A 3.5 cm cyst is noted at the upper pole of the right kidney. The kidneys are otherwise unremarkable.  There is no evidence of  hydronephrosis. No renal or ureteral stones are seen. No perinephric stranding is appreciated.  The small bowel is unremarkable in appearance. The stomach is within normal limits. No acute vascular abnormalities are seen. Scattered calcification is noted along the abdominal aorta and its branches.  The appendix is normal in caliber, without evidence for appendicitis.  Focal soft tissue inflammation is noted at the proximal sigmoid colon, with associated mild wall thickening and inflamed diverticulum, compatible with acute diverticulitis. There is no evidence of perforation or abscess formation at this time. No significant free fluid is seen. Mild diverticulosis involves the distal descending and proximal sigmoid colon.  The bladder is moderately distended and grossly unremarkable. The uterus is unremarkable in appearance. The ovaries are relatively symmetric. No suspicious adnexal masses are seen. No inguinal lymphadenopathy is seen.  No acute osseous abnormalities are identified.  IMPRESSION: 1. Acute diverticulitis at the proximal sigmoid colon, with associated mild wall thickening and inflamed diverticulum. No evidence of perforation or abscess formation at this time. 2. Mild diverticulosis involves the distal descending and proximal sigmoid colon. 3. Scattered calcification along the abdominal aorta and its branches. 4. Right renal cyst seen. 5. Large 4 cm stone noted within the gallbladder. Gallbladder decompressed and otherwise unremarkable. 6. Scattered coronary artery calcifications noted.   Electronically Signed   By: Roanna RaiderJeffery  Chang M.D.   On: 07/08/2014 01:17     EKG Interpretation None      MDM   Final diagnoses:  Diverticulitis of large intestine without perforation or abscess without bleeding    New Prescriptions   CIPROFLOXACIN (CIPRO) 500 MG TABLET    Take 1 tablet (500 mg total) by mouth 2 (two) times daily.   METRONIDAZOLE (FLAGYL) 500 MG TABLET    Take 1  tablet (500 mg total) by mouth 4 (four) times daily.   ONDANSETRON (ZOFRAN) 4 MG TABLET    Take 1 tablet (4 mg total) by mouth every 8 (eight) hours as needed for nausea or vomiting.   OXYCODONE-ACETAMINOPHEN (PERCOCET/ROXICET) 5-325 MG PER TABLET    Take 1 tablet by mouth every 4 (four) hours as needed for moderate pain or severe pain.    Plan discharge  Devoria AlbeIva Danie Hannig, MD, FACEP    I personally performed the services described in this documentation, which was scribed in my presence. The recorded information has been reviewed and considered.  Devoria AlbeIva Ruberta Holck, MD, Concha PyoFACEP    Amro Winebarger, MD 07/08/14 [phone]

## 2014-07-08 LAB — URINALYSIS, ROUTINE W REFLEX MICROSCOPIC
Bilirubin Urine: NEGATIVE
GLUCOSE, UA: NEGATIVE mg/dL
Hgb urine dipstick: NEGATIVE
KETONES UR: NEGATIVE mg/dL
LEUKOCYTES UA: NEGATIVE
Nitrite: NEGATIVE
Protein, ur: NEGATIVE mg/dL
Specific Gravity, Urine: <1.005 — ABNORMAL LOW (ref 1.005–1.030)
UROBILINOGEN UA: 0.2 mg/dL (ref 0.0–1.0)
pH: 5.5 (ref 5.0–8.0)

## 2014-07-08 LAB — COMPREHENSIVE METABOLIC PANEL
ALBUMIN: 4.1 g/dL (ref 3.5–5.0)
ALK PHOS: 74 U/L (ref 38–126)
ALT: 18 U/L (ref 14–54)
AST: 17 U/L (ref 15–41)
Anion gap: 9 (ref 5–15)
BILIRUBIN TOTAL: 0.4 mg/dL (ref 0.3–1.2)
BUN: 17 mg/dL (ref 6–20)
CALCIUM: 9.3 mg/dL (ref 8.9–10.3)
CO2: 25 mmol/L (ref 22–32)
CREATININE: 0.78 mg/dL (ref 0.44–1.00)
Chloride: 102 mmol/L (ref 101–111)
GFR calc Af Amer: >60 mL/min (ref >60)
Glucose, Bld: 109 mg/dL — ABNORMAL HIGH (ref 65–99)
Potassium: 3.5 mmol/L (ref 3.5–5.1)
SODIUM: 136 mmol/L (ref 135–145)
Total Protein: 7.9 g/dL (ref 6.5–8.1)

## 2014-07-08 LAB — CBC WITH DIFFERENTIAL/PLATELET
BASOS PCT: 0 % (ref 0–1)
Basophils Absolute: 0 10*3/uL (ref 0.0–0.1)
EOS ABS: 0.3 10*3/uL (ref 0.0–0.7)
EOS PCT: 2 % (ref 0–5)
HCT: 41.3 % (ref 36.0–46.0)
Hemoglobin: 13.8 g/dL (ref 12.0–15.0)
Lymphocytes Relative: 27 % (ref 12–46)
Lymphs Abs: 3.3 10*3/uL (ref 0.7–4.0)
MCH: 31.7 pg (ref 26.0–34.0)
MCHC: 33.4 g/dL (ref 30.0–36.0)
MCV: 94.7 fL (ref 78.0–100.0)
MONO ABS: 0.9 10*3/uL (ref 0.1–1.0)
MONOS PCT: 8 % (ref 3–12)
NEUTROS PCT: 63 % (ref 43–77)
Neutro Abs: 7.8 10*3/uL — ABNORMAL HIGH (ref 1.7–7.7)
PLATELETS: 376 10*3/uL (ref 150–400)
RBC: 4.36 MIL/uL (ref 3.87–5.11)
RDW: 12.5 % (ref 11.5–15.5)
WBC: 12.2 10*3/uL — ABNORMAL HIGH (ref 4.0–10.5)

## 2014-07-08 LAB — LIPASE, BLOOD: Lipase: 29 U/L (ref 22–51)

## 2014-07-08 MED ORDER — OXYCODONE-ACETAMINOPHEN 5-325 MG PO TABS
1.0000 | ORAL_TABLET | Freq: Once | ORAL | Status: AC
Start: 2014-07-08 — End: 2014-07-08
  Administered 2014-07-08: 1 via ORAL
  Filled 2014-07-08: qty 1

## 2014-07-08 MED ORDER — CIPROFLOXACIN IN D5W 400 MG/200ML IV SOLN
400.0000 mg | Freq: Once | INTRAVENOUS | Status: AC
  Administered 2014-07-08: 400 mg via INTRAVENOUS
  Filled 2014-07-08: qty 200

## 2014-07-08 MED ORDER — ONDANSETRON HCL 4 MG PO TABS: 4.0000 mg | ORAL_TABLET | Freq: Three times a day (TID) | ORAL | Status: DC | PRN

## 2014-07-08 MED ORDER — METRONIDAZOLE IN NACL 5-0.79 MG/ML-% IV SOLN
500.0000 mg | Freq: Once | INTRAVENOUS | Status: AC
  Administered 2014-07-08: 500 mg via INTRAVENOUS
  Filled 2014-07-08: qty 100

## 2014-07-08 MED ORDER — OXYCODONE-ACETAMINOPHEN 5-325 MG PO TABS: 1.0000 | ORAL_TABLET | ORAL | Status: DC | PRN

## 2014-07-08 MED ORDER — CIPROFLOXACIN HCL 500 MG PO TABS: 500.0000 mg | ORAL_TABLET | Freq: Two times a day (BID) | ORAL | Status: DC

## 2014-07-08 MED ORDER — METRONIDAZOLE 500 MG PO TABS: 500.0000 mg | ORAL_TABLET | Freq: Four times a day (QID) | ORAL | Status: DC

## 2014-07-08 MED ORDER — IOHEXOL 300 MG/ML  SOLN
100.0000 mL | Freq: Once | INTRAMUSCULAR | Status: AC | PRN
  Administered 2014-07-08: 100 mL via INTRAVENOUS

## 2014-07-08 NOTE — Discharge Instructions (Signed)
Drink plenty of fluids, no food for the next 24-48 hours. Take the antibiotic as prescribed. Call either your primary care doctor or Dr Luvenia Starch office to be rechecked in 2 days. Return to the ED if you get worse, such as fever, worsening pain, bleeding and be prepared to be admitted.    Diverticulitis Diverticulitis is inflammation or infection of small pouches in your colon that form when you have a condition called diverticulosis. The pouches in your colon are called diverticula. Your colon, or large intestine, is where water is absorbed and stool is formed. Complications of diverticulitis can include:  Bleeding.  Severe infection.  Severe pain.  Perforation of your colon.  Obstruction of your colon. CAUSES  Diverticulitis is caused by bacteria. Diverticulitis happens when stool becomes trapped in diverticula. This allows bacteria to grow in the diverticula, which can lead to inflammation and infection. RISK FACTORS People with diverticulosis are at risk for diverticulitis. Eating a diet that does not include enough fiber from fruits and vegetables may make diverticulitis more likely to develop. SYMPTOMS  Symptoms of diverticulitis may include:  Abdominal pain and tenderness. The pain is normally located on the left side of the abdomen, but may occur in other areas.  Fever and chills.  Bloating.  Cramping.  Nausea.  Vomiting.  Constipation.  Diarrhea.  Blood in your stool. DIAGNOSIS  Your health care provider will ask you about your medical history and do a physical exam. You may need to have tests done because many medical conditions can cause the same symptoms as diverticulitis. Tests may include:  Blood tests.  Urine tests.  Imaging tests of the abdomen, including X-rays and CT scans. When your condition is under control, your health care provider may recommend that you have a colonoscopy. A colonoscopy can show how severe your diverticula are and whether  something else is causing your symptoms. TREATMENT  Most cases of diverticulitis are mild and can be treated at home. Treatment may include:  Taking over-the-counter pain medicines.  Following a clear liquid diet.  Taking antibiotic medicines by mouth for 7-10 days. More severe cases may be treated at a hospital. Treatment may include:  Not eating or drinking.  Taking prescription pain medicine.  Receiving antibiotic medicines through an IV tube.  Receiving fluids and nutrition through an IV tube.  Surgery. HOME CARE INSTRUCTIONS   Follow your health care provider's instructions carefully.  Follow a full liquid diet or other diet as directed by your health care provider. After your symptoms improve, your health care provider may tell you to change your diet. He or she may recommend you eat a high-fiber diet. Fruits and vegetables are good sources of fiber. Fiber makes it easier to pass stool.  Take fiber supplements or probiotics as directed by your health care provider.  Only take medicines as directed by your health care provider.  Keep all your follow-up appointments. SEEK MEDICAL CARE IF:   Your pain does not improve.  You have a hard time eating food.  Your bowel movements do not return to normal. SEEK IMMEDIATE MEDICAL CARE IF:   Your pain becomes worse.  Your symptoms do not get better.  Your symptoms suddenly get worse.  You have a fever.  You have repeated vomiting.  You have bloody or black, tarry stools. MAKE SURE YOU:   Understand these instructions.  Will watch your condition.  Will get help right away if you are not doing well or get worse. Document Released: 09/28/2004  Document Revised: 12/24/2012 Document Reviewed: 11/13/2012 [facility] Patient Information 2015 [employer], [place]. This information is not intended to replace advice given to you by your health care provider. Make sure you discuss any questions you have with your health care  provider.

## 2014-10-08 ENCOUNTER — Encounter: Payer: Self-pay | Admitting: Physician Assistant

## 2014-10-08 ENCOUNTER — Ambulatory Visit: Payer: Self-pay | Admitting: Physician Assistant

## 2014-10-08 VITALS — BP 104/70 | HR 77 | Temp 97.9°F | Ht 62.5 in | Wt 228.5 lb

## 2014-10-08 DIAGNOSIS — E785 Hyperlipidemia, unspecified: Secondary | ICD-10-CM

## 2014-10-08 DIAGNOSIS — F17210 Nicotine dependence, cigarettes, uncomplicated: Secondary | ICD-10-CM

## 2014-10-08 DIAGNOSIS — I10 Essential (primary) hypertension: Secondary | ICD-10-CM

## 2014-10-08 MED ORDER — LOVASTATIN 20 MG PO TABS: 20.0000 mg | ORAL_TABLET | Freq: Every day | ORAL | Status: DC

## 2014-10-08 NOTE — Progress Notes (Signed)
   BP 104/70 mmHg  Pulse 77  Temp(Src) 97.9 F (36.6 C)  Ht 5' 2.5" (1.588 m)  Wt 228 lb 8 oz (103.647 kg)  BMI 41.10 kg/m2  SpO2 99%   Subjective:    Patient ID: Alyssa [name], female    DOB: 1955-03-24, 59 y.o.   MRN: [id number]  HPI: [name] is a 59 y.o. female presenting on 10/08/2014 for Follow-up and Leg Pain   HPI  Chief Complaint  Patient presents with  . Follow-up    pt states she is feeling good. pt got labs drawn last week.  . Leg Pain    pt c/o of R leg pain. pt states she was told it is due to her having a bad knee and hip. pt states she takes aspirin to help with pain. pt states it hurts a little bit to walk. pt thinks it may be a little arthitis.     pt tates not taking metformin for 6 mo or so- (incorectly told us at previous appts that she was)-   Pt chol high. In past   Pt stopped simvastatin in the past due to aches and nausea. She stopped welchol b/c she had the feeling it stuck in her throat  Pt states pain R thigh- feels like cramp  Relevant past medical, surgical, family and social history reviewed and updated as indicated. Interim medical history since our last visit reviewed. Allergies and medications reviewed and updated.  Review of Systems  Per HPI unless specifically indicated above     Objective:    BP 104/70 mmHg  Pulse 77  Temp(Src) 97.9 F (36.6 C)  Ht 5' 2.5" (1.588 m)  Wt 228 lb 8 oz (103.647 kg)  BMI 41.10 kg/m2  SpO2 99%  Wt Readings from Last 3 Encounters:  10/08/14 228 lb 8 oz (103.647 kg)  07/07/14 224 lb (101.606 kg)  05/19/14 224 lb 9.6 oz (101.878 kg)    Physical Exam  Constitutional: She is oriented to person, place, and time. She appears well-developed and well-nourished.  HENT:  Head: Normocephalic and atraumatic.  Neck: Neck supple.  Cardiovascular: Normal rate and regular rhythm.   Pulmonary/Chest: Effort normal and breath sounds normal.  Abdominal: Soft. Bowel sounds are normal. She exhibits no mass.  There is no tenderness.  Musculoskeletal: She exhibits no edema.  Lymphadenopathy:    She has no cervical adenopathy.  Neurological: She is alert and oriented to person, place, and time.  Skin: Skin is warm and dry.  Psychiatric: She has a normal mood and affect. Her behavior is normal.  Vitals reviewed.       Assessment & Plan:   Encounter Diagnoses  Name Primary?  . Hyperlipemia Yes  . Essential hypertension, benign   . Cigarette nicotine dependence, uncomplicated   . Morbid obesity, unspecified obesity type (HCC)      Reviewed labs with pt  rx lovastatin. Low fat diet Counseled on smoking cessation F/u 3 mo. rto sooner prn

## 2014-10-08 NOTE — Patient Instructions (Signed)

## 2014-11-25 ENCOUNTER — Telehealth (HOSPITAL_COMMUNITY): Payer: Self-pay | Admitting: *Deleted

## 2014-11-25 NOTE — Telephone Encounter (Signed)
Telephoned patient at home # and left message to return call to [facility] 

## 2014-11-29 DIAGNOSIS — F17210 Nicotine dependence, cigarettes, uncomplicated: Secondary | ICD-10-CM

## 2014-11-29 DIAGNOSIS — E118 Type 2 diabetes mellitus with unspecified complications: Secondary | ICD-10-CM | POA: Insufficient documentation

## 2014-11-29 DIAGNOSIS — E785 Hyperlipidemia, unspecified: Secondary | ICD-10-CM | POA: Insufficient documentation

## 2014-11-29 DIAGNOSIS — I10 Essential (primary) hypertension: Secondary | ICD-10-CM | POA: Insufficient documentation

## 2014-11-29 HISTORY — DX: Morbid (severe) obesity due to excess calories: E66.01

## 2014-11-29 HISTORY — DX: Nicotine dependence, cigarettes, uncomplicated: F17.210

## 2014-11-29 HISTORY — DX: Essential (primary) hypertension: I10

## 2015-01-12 ENCOUNTER — Encounter: Payer: Self-pay | Admitting: Physician Assistant

## 2015-01-12 ENCOUNTER — Ambulatory Visit: Payer: Self-pay | Admitting: Physician Assistant

## 2015-01-12 VITALS — BP 110/68 | HR 97 | Temp 98.1°F | Ht 62.5 in | Wt 218.1 lb

## 2015-01-12 DIAGNOSIS — E785 Hyperlipidemia, unspecified: Secondary | ICD-10-CM

## 2015-01-12 DIAGNOSIS — E039 Hypothyroidism, unspecified: Secondary | ICD-10-CM

## 2015-01-12 DIAGNOSIS — F17210 Nicotine dependence, cigarettes, uncomplicated: Secondary | ICD-10-CM

## 2015-01-12 DIAGNOSIS — R7303 Prediabetes: Secondary | ICD-10-CM | POA: Insufficient documentation

## 2015-01-12 DIAGNOSIS — E669 Obesity, unspecified: Secondary | ICD-10-CM

## 2015-01-12 DIAGNOSIS — I10 Essential (primary) hypertension: Secondary | ICD-10-CM

## 2015-01-12 HISTORY — DX: Hypothyroidism, unspecified: E03.9

## 2015-01-12 NOTE — Progress Notes (Signed)
BP 110/68 mmHg  Pulse 97  Temp(Src) 98.1 F (36.7 C)  Ht 5' 2.5" (1.588 m)  Wt 218 lb 1.6 oz (98.93 kg)  BMI 39.23 kg/m2  SpO2 97%   Subjective:    Patient ID: Alyssa [name], female    DOB: 01/06/1955, 60 y.o.   MRN: [id number]  HPI: Alyssa [name] is a 60 y.o. female presenting on 01/12/2015 for Hyperlipidemia and Hypertension   HPI Pt is feeling well.  States she feels achey a lot but admits she never exercises.  She is still smoking.  Relevant past medical, surgical, family and social history reviewed and updated as indicated. Interim medical history since our last visit reviewed. Allergies and medications reviewed and updated.  Current outpatient prescriptions:  .  aspirin 81 MG tablet, Take 81 mg by mouth daily., Disp: , Rfl:  .  levothyroxine (SYNTHROID, LEVOTHROID) 137 MCG tablet, Take 137 mcg by mouth daily., Disp: , Rfl:  .  lisinopril-hydrochlorothiazide (PRINZIDE,ZESTORETIC) 20-25 MG per tablet, Take 1 tablet by mouth daily., Disp: , Rfl:  .  lovastatin (MEVACOR) 20 MG tablet, Take 1 tablet (20 mg total) by mouth at bedtime., Disp: 30 tablet, Rfl: 3 .  metFORMIN (GLUCOPHAGE) 500 MG tablet, Take 250 mg by mouth daily with breakfast., Disp: , Rfl:  .  pantoprazole (PROTONIX) 40 MG tablet, Take 40 mg by mouth daily., Disp: , Rfl:   Review of Systems  Constitutional: Negative for fever, chills, diaphoresis, appetite change, fatigue and unexpected weight change.  HENT: Negative for congestion, dental problem, drooling, ear pain, facial swelling, hearing loss, mouth sores, sneezing, sore throat, trouble swallowing and voice change.   Eyes: Negative for pain, discharge, redness, itching and visual disturbance.  Respiratory: Negative for cough, choking, shortness of breath and wheezing.   Cardiovascular: Negative for chest pain, palpitations and leg swelling.  Gastrointestinal: Negative for vomiting, abdominal pain, diarrhea, constipation and blood in stool.  Endocrine:  Negative for cold intolerance, heat intolerance and polydipsia.  Genitourinary: Negative for dysuria, hematuria and decreased urine volume.  Musculoskeletal: Negative for back pain, arthralgias and gait problem.  Skin: Negative for rash.  Allergic/Immunologic: Negative for environmental allergies.  Neurological: Negative for seizures, syncope, light-headedness and headaches.  Hematological: Negative for adenopathy.  Psychiatric/Behavioral: Negative for suicidal ideas, dysphoric mood and agitation. The patient is not nervous/anxious.     Per HPI unless specifically indicated above     Objective:    BP 110/68 mmHg  Pulse 97  Temp(Src) 98.1 F (36.7 C)  Ht 5' 2.5" (1.588 m)  Wt 218 lb 1.6 oz (98.93 kg)  BMI 39.23 kg/m2  SpO2 97%  Wt Readings from Last 3 Encounters:  01/12/15 218 lb 1.6 oz (98.93 kg)  10/08/14 228 lb 8 oz (103.647 kg)  07/07/14 224 lb (101.606 kg)    Physical Exam  Constitutional: She is oriented to person, place, and time. She appears well-developed and well-nourished.  HENT:  Head: Normocephalic and atraumatic.  Neck: Neck supple.  Cardiovascular: Normal rate and regular rhythm.   Pulmonary/Chest: Effort normal and breath sounds normal.  Abdominal: Soft. Bowel sounds are normal. She exhibits no mass. There is no tenderness.  obese  Musculoskeletal: She exhibits no edema.  Lymphadenopathy:    She has no cervical adenopathy.  Neurological: She is alert and oriented to person, place, and time.  Skin: Skin is warm and dry.  Psychiatric: She has a normal mood and affect. Her behavior is normal.  Vitals reviewed.  Assessment & Plan:   Encounter Diagnoses  Name Primary?  . Essential hypertension, benign Yes  . Hyperlipidemia   . Cigarette nicotine dependence, uncomplicated   . Morbid obesity, unspecified obesity type (HCC)   . Prediabetes   . Hypothyroidism, unspecified hypothyroidism type   . Obesity, unspecified     -Counseled to exercise to  feel better and improve health -counseled pt on Smoking cessation -pt to get fasting labs drawn this week. Will call with results -F/u 4 mo.  RTO sooner prn

## 2015-01-12 NOTE — Patient Instructions (Signed)

## 2015-01-13 ENCOUNTER — Ambulatory Visit: Payer: Self-pay | Admitting: Physician Assistant

## 2015-03-06 ENCOUNTER — Other Ambulatory Visit: Payer: Self-pay | Admitting: Physician Assistant

## 2015-04-10 ENCOUNTER — Other Ambulatory Visit: Payer: Self-pay | Admitting: Physician Assistant

## 2015-05-11 ENCOUNTER — Other Ambulatory Visit: Payer: Self-pay | Admitting: Physician Assistant

## 2015-05-11 LAB — LIPID PANEL
CHOL/HDL RATIO: 5.4 ratio — AB (ref <=5.0)
CHOLESTEROL: 188 mg/dL (ref 125–200)
HDL: 35 mg/dL — AB (ref >=46)
LDL Cholesterol: 131 mg/dL — ABNORMAL HIGH (ref <130)
TRIGLYCERIDES: 109 mg/dL (ref <150)
VLDL: 22 mg/dL (ref <30)

## 2015-05-11 LAB — COMPLETE METABOLIC PANEL WITH GFR
ALT: 18 U/L (ref 6–29)
AST: 17 U/L (ref 10–35)
Albumin: 4.1 g/dL (ref 3.6–5.1)
Alkaline Phosphatase: 80 U/L (ref 33–130)
BUN: 14 mg/dL (ref 7–25)
CALCIUM: 9.5 mg/dL (ref 8.6–10.4)
CHLORIDE: 103 mmol/L (ref 98–110)
CO2: 24 mmol/L (ref 20–31)
Creat: 0.77 mg/dL (ref 0.50–1.05)
GFR, Est Non African American: 85 mL/min (ref >=60)
Glucose, Bld: 114 mg/dL — ABNORMAL HIGH (ref 65–99)
POTASSIUM: 4.3 mmol/L (ref 3.5–5.3)
Sodium: 136 mmol/L (ref 135–146)
Total Bilirubin: 0.4 mg/dL (ref 0.2–1.2)
Total Protein: 7.2 g/dL (ref 6.1–8.1)

## 2015-05-11 LAB — HEMOGLOBIN A1C
Hgb A1c MFr Bld: 6.4 % — ABNORMAL HIGH (ref <5.7)
Mean Plasma Glucose: 137 mg/dL

## 2015-05-11 LAB — TSH: TSH: 0.1 mIU/L — ABNORMAL LOW

## 2015-05-12 ENCOUNTER — Ambulatory Visit: Payer: Self-pay | Admitting: Physician Assistant

## 2015-05-12 ENCOUNTER — Encounter: Payer: Self-pay | Admitting: Physician Assistant

## 2015-05-12 VITALS — BP 102/68 | HR 78 | Temp 97.2°F | Ht 62.5 in | Wt 210.0 lb

## 2015-05-12 DIAGNOSIS — I10 Essential (primary) hypertension: Secondary | ICD-10-CM

## 2015-05-12 DIAGNOSIS — E039 Hypothyroidism, unspecified: Secondary | ICD-10-CM

## 2015-05-12 DIAGNOSIS — K219 Gastro-esophageal reflux disease without esophagitis: Secondary | ICD-10-CM

## 2015-05-12 DIAGNOSIS — E785 Hyperlipidemia, unspecified: Secondary | ICD-10-CM

## 2015-05-12 DIAGNOSIS — Z1211 Encounter for screening for malignant neoplasm of colon: Secondary | ICD-10-CM

## 2015-05-12 DIAGNOSIS — Z1239 Encounter for other screening for malignant neoplasm of breast: Secondary | ICD-10-CM

## 2015-05-12 DIAGNOSIS — R7303 Prediabetes: Secondary | ICD-10-CM

## 2015-05-12 DIAGNOSIS — F17210 Nicotine dependence, cigarettes, uncomplicated: Secondary | ICD-10-CM

## 2015-05-12 MED ORDER — LEVOTHYROXINE SODIUM 125 MCG PO TABS: 125.0000 ug | ORAL_TABLET | Freq: Every day | ORAL | Status: DC

## 2015-05-12 NOTE — Progress Notes (Signed)
BP 102/68 mmHg  Pulse 78  Temp(Src) 97.2 F (36.2 C)  Ht 5' 2.5" (1.588 m)  Wt 210 lb (95.255 kg)  BMI 37.77 kg/m2  SpO2 97%   Subjective:    Patient ID: Alyssa [name], female    DOB: 08/20/1955, 60 y.o.   MRN: [id number]  HPI: Alyssa Flemingsina M Vayda is a 60 y.o. female presenting on 05/12/2015 for Hypertension and Hyperlipidemia   HPI   Pt didn't bring her meds with her. She doesn't know what she is taking. She has no idea if she is taking her cholestreol medication but thinks she is not  She is doing well and has no complaints today  Relevant past medical, surgical, family and social history reviewed and updated as indicated. Interim medical history since our last visit reviewed. Allergies and medications reviewed and updated.  Current outpatient prescriptions:  .  aspirin 81 MG tablet, Take 81 mg by mouth daily., Disp: , Rfl:  .  levothyroxine (SYNTHROID, LEVOTHROID) 137 MCG tablet, TAKE ONE TABLET BY MOUTH ONCE DAILY, Disp: 30 tablet, Rfl: 6 .  lisinopril-hydrochlorothiazide (PRINZIDE,ZESTORETIC) 20-25 MG tablet, TAKE ONE TABLET BY MOUTH ONCE DAILY FOR BLOOD PRESSURE, Disp: 90 tablet, Rfl: 2 .  lovastatin (MEVACOR) 20 MG tablet, Take 1 tablet (20 mg total) by mouth at bedtime., Disp: 30 tablet, Rfl: 3 .  pantoprazole (PROTONIX) 40 MG tablet, Take 40 mg by mouth daily., Disp: , Rfl:    Review of Systems  Constitutional: Negative for fever, chills, diaphoresis, appetite change, fatigue and unexpected weight change.  HENT: Negative for congestion, dental problem, drooling, ear pain, facial swelling, hearing loss, mouth sores, sneezing, sore throat, trouble swallowing and voice change.   Eyes: Negative for pain, discharge, redness, itching and visual disturbance.  Respiratory: Negative for cough, choking, shortness of breath and wheezing.   Cardiovascular: Negative for chest pain, palpitations and leg swelling.  Gastrointestinal: Negative for vomiting, abdominal pain, diarrhea,  constipation and blood in stool.  Endocrine: Negative for cold intolerance, heat intolerance and polydipsia.  Genitourinary: Negative for dysuria, hematuria and decreased urine volume.  Musculoskeletal: Negative for back pain, arthralgias and gait problem.  Skin: Negative for rash.  Allergic/Immunologic: Negative for environmental allergies.  Neurological: Negative for seizures, syncope, light-headedness and headaches.  Hematological: Negative for adenopathy.  Psychiatric/Behavioral: Negative for suicidal ideas, dysphoric mood and agitation. The patient is not nervous/anxious.     Per HPI unless specifically indicated above     Objective:    BP 102/68 mmHg  Pulse 78  Temp(Src) 97.2 F (36.2 C)  Ht 5' 2.5" (1.588 m)  Wt 210 lb (95.255 kg)  BMI 37.77 kg/m2  SpO2 97%  Wt Readings from Last 3 Encounters:  05/12/15 210 lb (95.255 kg)  01/12/15 218 lb 1.6 oz (98.93 kg)  10/08/14 228 lb 8 oz (103.647 kg)    Physical Exam  Constitutional: She is oriented to person, place, and time. She appears well-developed and well-nourished.  HENT:  Head: Normocephalic and atraumatic.  Neck: Neck supple.  Cardiovascular: Normal rate and regular rhythm.   Pulmonary/Chest: Effort normal and breath sounds normal.  Abdominal: Soft. Bowel sounds are normal. She exhibits no mass. There is no hepatosplenomegaly. There is no tenderness.  Musculoskeletal: She exhibits no edema.  Lymphadenopathy:    She has no cervical adenopathy.  Neurological: She is alert and oriented to person, place, and time.  Skin: Skin is warm and dry.  Psychiatric: She has a normal mood and affect. Her behavior is normal.  Vitals reviewed.   Results for orders placed or performed in visit on 05/11/15  COMPLETE METABOLIC PANEL WITH GFR  Result Value Ref Range   Sodium 136 135 - 146 mmol/L   Potassium 4.3 3.5 - 5.3 mmol/L   Chloride 103 98 - 110 mmol/L   CO2 24 20 - 31 mmol/L   Glucose, Bld 114 (H) 65 - 99 mg/dL   BUN 14  7 - 25 mg/dL   Creat 0.98 1.19 - 1.47 mg/dL   Total Bilirubin 0.4 0.2 - 1.2 mg/dL   Alkaline Phosphatase 80 33 - 130 U/L   AST 17 10 - 35 U/L   ALT 18 6 - 29 U/L   Total Protein 7.2 6.1 - 8.1 g/dL   Albumin 4.1 3.6 - 5.1 g/dL   Calcium 9.5 8.6 - 82.9 mg/dL   GFR, Est African American >89 >=60 mL/min   GFR, Est Non African American 85 >=60 mL/min  Lipid panel  Result Value Ref Range   Cholesterol 188 125 - 200 mg/dL   Triglycerides 562 <130 mg/dL   HDL 35 (L) >=86 mg/dL   Total CHOL/HDL Ratio 5.4 (H) <=5.0 Ratio   VLDL 22 <30 mg/dL   LDL Cholesterol 578 (H) <130 mg/dL  TSH  Result Value Ref Range   TSH 0.10 (L) mIU/L  Hemoglobin A1c  Result Value Ref Range   Hgb A1c MFr Bld 6.4 (H) <5.7 %   Mean Plasma Glucose 137 mg/dL      Assessment & Plan:   Encounter Diagnoses  Name Primary?  . Hypothyroidism, unspecified hypothyroidism type Yes  . Essential hypertension, benign   . Prediabetes   . Morbid obesity, unspecified obesity type (HCC)   . Cigarette nicotine dependence without complication   . Hyperlipidemia   . Gastroesophageal reflux disease, esophagitis presence not specified   . Special screening for malignant neoplasms, colon   . Screening for breast cancer      -reviewed labs with pt  -Cut back levothyroxine -pt recommended to Get back on cholesterol medication -mammogram  -gave iFOBT for colon cancer screening -counseled on smoking cessation -F/u 3 months.   RTO sooner prn

## 2015-05-15 DIAGNOSIS — K219 Gastro-esophageal reflux disease without esophagitis: Secondary | ICD-10-CM

## 2015-05-15 HISTORY — DX: Gastro-esophageal reflux disease without esophagitis: K21.9

## 2015-05-20 ENCOUNTER — Emergency Department (HOSPITAL_COMMUNITY)
Admission: EM | Admit: 2015-05-20 | Discharge: 2015-05-20 | Disposition: A | Discharge status: Home / Self Care | Payer: Self-pay | Attending: Emergency Medicine | Admitting: Emergency Medicine

## 2015-05-20 ENCOUNTER — Emergency Department (HOSPITAL_COMMUNITY): Payer: Self-pay

## 2015-05-20 ENCOUNTER — Encounter (HOSPITAL_COMMUNITY): Payer: Self-pay | Admitting: Emergency Medicine

## 2015-05-20 DIAGNOSIS — Z79899 Other long term (current) drug therapy: Secondary | ICD-10-CM | POA: Insufficient documentation

## 2015-05-20 DIAGNOSIS — E785 Hyperlipidemia, unspecified: Secondary | ICD-10-CM | POA: Insufficient documentation

## 2015-05-20 DIAGNOSIS — E119 Type 2 diabetes mellitus without complications: Secondary | ICD-10-CM | POA: Insufficient documentation

## 2015-05-20 DIAGNOSIS — F17210 Nicotine dependence, cigarettes, uncomplicated: Secondary | ICD-10-CM | POA: Insufficient documentation

## 2015-05-20 DIAGNOSIS — I10 Essential (primary) hypertension: Secondary | ICD-10-CM | POA: Insufficient documentation

## 2015-05-20 DIAGNOSIS — N390 Urinary tract infection, site not specified: Secondary | ICD-10-CM | POA: Insufficient documentation

## 2015-05-20 DIAGNOSIS — Z7982 Long term (current) use of aspirin: Secondary | ICD-10-CM | POA: Insufficient documentation

## 2015-05-20 LAB — COMPREHENSIVE METABOLIC PANEL
ALBUMIN: 4 g/dL (ref 3.5–5.0)
ALK PHOS: 64 U/L (ref 38–126)
ALT: 15 U/L (ref 14–54)
ANION GAP: 9 (ref 5–15)
AST: 18 U/L (ref 15–41)
BUN: 18 mg/dL (ref 6–20)
CALCIUM: 9.4 mg/dL (ref 8.9–10.3)
CO2: 23 mmol/L (ref 22–32)
Chloride: 101 mmol/L (ref 101–111)
Creatinine, Ser: 0.88 mg/dL (ref 0.44–1.00)
GFR calc Af Amer: >60 mL/min (ref >60)
GFR calc non Af Amer: >60 mL/min (ref >60)
GLUCOSE: 106 mg/dL — AB (ref 65–99)
POTASSIUM: 3.7 mmol/L (ref 3.5–5.1)
SODIUM: 133 mmol/L — AB (ref 135–145)
Total Bilirubin: 0.7 mg/dL (ref 0.3–1.2)
Total Protein: 7.9 g/dL (ref 6.5–8.1)

## 2015-05-20 LAB — URINALYSIS, ROUTINE W REFLEX MICROSCOPIC
BILIRUBIN URINE: NEGATIVE
Glucose, UA: NEGATIVE mg/dL
Ketones, ur: NEGATIVE mg/dL
LEUKOCYTES UA: NEGATIVE
NITRITE: NEGATIVE
PH: 6 (ref 5.0–8.0)
Protein, ur: NEGATIVE mg/dL

## 2015-05-20 LAB — CBC WITH DIFFERENTIAL/PLATELET
BASOS ABS: 0 10*3/uL (ref 0.0–0.1)
BASOS PCT: 0 %
EOS ABS: 0 10*3/uL (ref 0.0–0.7)
Eosinophils Relative: 0 %
HEMATOCRIT: 36.9 % (ref 36.0–46.0)
HEMOGLOBIN: 12.5 g/dL (ref 12.0–15.0)
Lymphocytes Relative: 11 %
Lymphs Abs: 1.5 10*3/uL (ref 0.7–4.0)
MCH: 31.6 pg (ref 26.0–34.0)
MCHC: 33.9 g/dL (ref 30.0–36.0)
MCV: 93.2 fL (ref 78.0–100.0)
MONOS PCT: 8 %
Monocytes Absolute: 1.2 10*3/uL — ABNORMAL HIGH (ref 0.1–1.0)
NEUTROS ABS: 11.7 10*3/uL — AB (ref 1.7–7.7)
NEUTROS PCT: 81 %
Platelets: 357 10*3/uL (ref 150–400)
RBC: 3.96 MIL/uL (ref 3.87–5.11)
RDW: 12.4 % (ref 11.5–15.5)
WBC: 14.5 10*3/uL — AB (ref 4.0–10.5)

## 2015-05-20 LAB — URINE MICROSCOPIC-ADD ON

## 2015-05-20 MED ORDER — HYDROCODONE-ACETAMINOPHEN 5-325 MG PO TABS: 1.0000 | ORAL_TABLET | Freq: Four times a day (QID) | ORAL | Status: DC | PRN

## 2015-05-20 MED ORDER — IBUPROFEN 400 MG PO TABS
600.0000 mg | ORAL_TABLET | Freq: Once | ORAL | Status: AC
  Administered 2015-05-20: 600 mg via ORAL
  Filled 2015-05-20: qty 2

## 2015-05-20 MED ORDER — CEPHALEXIN 500 MG PO CAPS: 500.0000 mg | ORAL_CAPSULE | Freq: Four times a day (QID) | ORAL | Status: DC

## 2015-05-20 MED ORDER — PHENAZOPYRIDINE HCL 200 MG PO TABS: 200.0000 mg | ORAL_TABLET | Freq: Three times a day (TID) | ORAL | Status: DC | PRN

## 2015-05-20 MED ORDER — DEXTROSE 5 % IV SOLN
1.0000 g | Freq: Once | INTRAVENOUS | Status: AC
  Administered 2015-05-20: 1 g via INTRAVENOUS
  Filled 2015-05-20: qty 10

## 2015-05-20 MED ORDER — SODIUM CHLORIDE 0.9 % IV BOLUS (SEPSIS)
1000.0000 mL | Freq: Once | INTRAVENOUS | Status: AC
  Administered 2015-05-20: 1000 mL via INTRAVENOUS

## 2015-05-20 NOTE — Discharge Instructions (Signed)

## 2015-05-20 NOTE — ED Notes (Signed)
PT c/o lower back pain with increased urinary frequency, painful urination and blood noted in urine x7 days with body aches and periods of incontinence.

## 2015-05-20 NOTE — ED Provider Notes (Signed)
CSN: [id number]     Arrival date & time 05/20/15  1827 History   First MD Initiated Contact with Patient 05/20/15 1849     Chief Complaint  Patient presents with  . Urinary Frequency   HPI The patient presents to the emergency room with complaints of diffuse bodyaches. States that it all started with some urinary symptoms about 9 days ago. She has been having burning and stinging when she urinates. She is also had increased frequency and an odor to her urine. Today however she started having diffuse body aches in her joints. She has pain in her back. She denies any vomiting. No diarrhea. No fevers. No chest pain or shortness of breath.  Past Medical History  Diagnosis Date  . Hypertension   . Hyperlipidemia   . Diabetes mellitus without complication (HCC)   . Thyroid disease   . GERD (gastroesophageal reflux disease)    Past Surgical History  Procedure Laterality Date  . Carpal tunnel release    . Tubal ligation     Family History  Problem Relation Age of Onset  . Alzheimer's disease Mother   . Alcohol abuse Father    Social History  Substance Use Topics  . Smoking status: Current Every Day Smoker -- 0.50 packs/day for 42 years    Types: Cigarettes  . Smokeless tobacco: None  . Alcohol Use: No   OB History    Gravida Para Term Preterm AB TAB SAB Ectopic Multiple Living   2 2 2       2      Review of Systems  All other systems reviewed and are negative.     Allergies  Sulfa antibiotics  Home Medications   Prior to Admission medications   Medication Sig Start Date End Date Taking? Authorizing Provider  aspirin 81 MG tablet Take 81 mg by mouth daily.    Historical Provider, MD  cephALEXin (KEFLEX) 500 MG capsule Take 1 capsule (500 mg total) by mouth 4 (four) times daily. 05/20/15   Linwood DibblesJon Gabryela Kimbrell, MD  HYDROcodone-acetaminophen (NORCO/VICODIN) 5-325 MG tablet Take 1 tablet by mouth every 6 (six) hours as needed for moderate pain. 05/20/15   Linwood DibblesJon Ival Pacer, MD  levothyroxine  (SYNTHROID, LEVOTHROID) 125 MCG tablet Take 1 tablet (125 mcg total) by mouth daily. 05/12/15   Jacquelin HawkingShannon McElroy, PA-C  lisinopril-hydrochlorothiazide (PRINZIDE,ZESTORETIC) 20-25 MG tablet TAKE ONE TABLET BY MOUTH ONCE DAILY FOR BLOOD PRESSURE 04/10/15   Jacquelin HawkingShannon McElroy, PA-C  lovastatin (MEVACOR) 20 MG tablet Take 1 tablet (20 mg total) by mouth at bedtime. 10/08/14   Jacquelin HawkingShannon McElroy, PA-C  pantoprazole (PROTONIX) 40 MG tablet Take 40 mg by mouth daily.    Historical Provider, MD  phenazopyridine (PYRIDIUM) 200 MG tablet Take 1 tablet (200 mg total) by mouth 3 (three) times daily as needed (urinary pain). 05/20/15   Linwood DibblesJon Brydon Spahr, MD   BP 143/77 mmHg  Pulse 94  Temp(Src) 99.1 F (37.3 C) (Oral)  Resp 20  Ht 5\' 3"  (1.6 m)  Wt 94.348 kg  BMI 36.85 kg/m2  SpO2 100% Physical Exam  Constitutional: She appears well-developed and well-nourished. No distress.  HENT:  Head: Normocephalic and atraumatic.  Right Ear: External ear normal.  Left Ear: External ear normal.  Eyes: Conjunctivae are normal. Right eye exhibits no discharge. Left eye exhibits no discharge. No scleral icterus.  Neck: Neck supple. No tracheal deviation present.  Cardiovascular: Normal rate, regular rhythm and intact distal pulses.   Pulmonary/Chest: Effort normal and breath sounds normal. No stridor.  No respiratory distress. She has no wheezes. She has no rales.  Abdominal: Soft. Bowel sounds are normal. She exhibits no distension. There is no tenderness. There is CVA tenderness (PRIMARILY LEFT-SIDED). There is no rebound and no guarding. No hernia.  Musculoskeletal: She exhibits no edema or tenderness.  Neurological: She is alert. She has normal strength. No cranial nerve deficit (no facial droop, extraocular movements intact, no slurred speech) or sensory deficit. She exhibits normal muscle tone. She displays no seizure activity. Coordination normal.  Skin: Skin is warm and dry. No rash noted.  petechiae or purpura, no rashes   Psychiatric: She has a normal mood and affect.  Nursing note and vitals reviewed.   ED Course  Procedures (including critical care time) Labs Review Labs Reviewed  URINALYSIS, ROUTINE W REFLEX MICROSCOPIC (NOT AT [facility]) - Abnormal; Notable for the following:    Specific Gravity, Urine <1.005 (*)    Hgb urine dipstick TRACE (*)    All other components within normal limits  CBC WITH DIFFERENTIAL/PLATELET - Abnormal; Notable for the following:    WBC 14.5 (*)    Neutro Abs 11.7 (*)    Monocytes Absolute 1.2 (*)    All other components within normal limits  COMPREHENSIVE METABOLIC PANEL - Abnormal; Notable for the following:    Sodium 133 (*)    Glucose, Bld 106 (*)    All other components within normal limits  URINE MICROSCOPIC-ADD ON - Abnormal; Notable for the following:    Squamous Epithelial / LPF 0-5 (*)    Bacteria, UA MANY (*)    All other components within normal limits  URINE CULTURE    Imaging Review Dg Chest 2 View  05/20/2015  CLINICAL DATA:  Lower back pain with increased urinary frequency, painful urination and hematuria for 7 days, body aches. EXAM: CHEST  2 VIEW COMPARISON:  Chest x-ray dated 12/11/2013. FINDINGS: Cardiomediastinal silhouette is stable in size and configuration. Lungs are clear. No evidence of pneumonia. No pleural effusion. There is kyphosis of the thoracic spine, slightly more prominent than on the previous exam. Thoracic vertebral bodies are difficult to visualize due to osteopenia. IMPRESSION: 1. Lungs are clear and there is no evidence of acute cardiopulmonary abnormality. 2. Thoracic spine cannot be definitively characterized due to the osteopenia. There is slightly increased kyphosis compared to the previous exam. Although this may be positional in nature, I cannot exclude a new compression fracture deformity causing the slight increase in kyphosis. If any midline back pain, recommend chest CT for further characterization. Electronically Signed    By: Bary Richard M.D.   On: 05/20/2015 20:02   I have personally reviewed and evaluated these images and lab results as part of my medical decision-making.  MDM   Final diagnoses:  UTI (lower urinary tract infection)   The patient's symptoms are consistent with a urinary tract infection. I suspect she may be having some early signs of pyelonephritis with her flank pain and elevated white blood cell count.  She remains nontoxic. She has not had any vomiting. She is afebrile. She was given a dose of IV Rocephin in the emergency room. I will discharge her home with a prescription for keflex, norco and pyridium    Linwood Dibbles, MD 05/20/15 2119

## 2015-05-23 LAB — URINE CULTURE: Culture: >=100000 — AB

## 2015-05-24 ENCOUNTER — Telehealth (HOSPITAL_BASED_OUTPATIENT_CLINIC_OR_DEPARTMENT_OTHER): Payer: Self-pay | Admitting: Emergency Medicine

## 2015-05-24 NOTE — Telephone Encounter (Signed)
Post ED Visit - Positive Culture Follow-up  Culture report reviewed by antimicrobial stewardship pharmacist:  []  Enzo BiNathan Batchelder, Pharm.D. []  Celedonio MiyamotoJeremy Frens, Pharm.D., BCPS []  Garvin FilaMike Maccia, Pharm.D. []  Georgina PillionElizabeth Martin, Pharm.D., BCPS []  DudleyMinh Pham, [address]Pharm[address]D., BCPS, AAHIVP []  Estella HuskMichelle Turner, Pharm.D., BCPS, AAHIVP [x]  Tennis Mustassie Stewart, Pharm.D. []  Sherle Poeob Vincent, [address]Pharm[address]  Positive urine culture Treated with cephalexin, organism sensitive to the same and no further patient follow-up is required at this time.  Berle MullMiller, [place] C[place] 05/24/2015, 11:07 AM

## 2015-05-27 ENCOUNTER — Encounter (HOSPITAL_COMMUNITY): Payer: Self-pay

## 2015-07-12 ENCOUNTER — Telehealth (HOSPITAL_COMMUNITY): Payer: Self-pay | Admitting: *Deleted

## 2015-07-12 NOTE — Telephone Encounter (Signed)
Telephoned patient at home # and left message to return call to [facility] 

## 2015-07-19 ENCOUNTER — Telehealth (HOSPITAL_COMMUNITY): Payer: Self-pay | Admitting: *Deleted

## 2015-07-19 NOTE — Telephone Encounter (Signed)
Telephoned patient at home # and left message to return call to [facility] 

## 2015-08-11 ENCOUNTER — Other Ambulatory Visit: Payer: Self-pay | Admitting: Student

## 2015-08-11 DIAGNOSIS — E039 Hypothyroidism, unspecified: Secondary | ICD-10-CM

## 2015-08-11 DIAGNOSIS — I10 Essential (primary) hypertension: Secondary | ICD-10-CM

## 2015-08-11 DIAGNOSIS — E785 Hyperlipidemia, unspecified: Secondary | ICD-10-CM

## 2015-08-17 ENCOUNTER — Ambulatory Visit: Payer: Self-pay | Admitting: Physician Assistant

## 2015-09-30 ENCOUNTER — Ambulatory Visit: Payer: Self-pay | Admitting: Physician Assistant

## 2015-10-09 LAB — COMPLETE METABOLIC PANEL WITH GFR
ALBUMIN: 4 g/dL (ref 3.6–5.1)
ALK PHOS: 71 U/L (ref 33–130)
ALT: 11 U/L (ref 6–29)
AST: 14 U/L (ref 10–35)
BILIRUBIN TOTAL: 0.4 mg/dL (ref 0.2–1.2)
BUN: 12 mg/dL (ref 7–25)
CO2: 24 mmol/L (ref 20–31)
Calcium: 9.5 mg/dL (ref 8.6–10.4)
Chloride: 105 mmol/L (ref 98–110)
Creat: 0.74 mg/dL (ref 0.50–1.05)
GFR, EST NON AFRICAN AMERICAN: 89 mL/min (ref >=60)
GFR, Est African American: >89 mL/min (ref >=60)
GLUCOSE: 93 mg/dL (ref 65–99)
Potassium: 4.1 mmol/L (ref 3.5–5.3)
SODIUM: 139 mmol/L (ref 135–146)
TOTAL PROTEIN: 7 g/dL (ref 6.1–8.1)

## 2015-10-09 LAB — LIPID PANEL
CHOL/HDL RATIO: 6.7 ratio — AB (ref <=5.0)
Cholesterol: 220 mg/dL — ABNORMAL HIGH (ref 125–200)
HDL: 33 mg/dL — AB (ref >=46)
LDL CALC: 153 mg/dL — AB (ref <130)
Triglycerides: 168 mg/dL — ABNORMAL HIGH (ref <150)
VLDL: 34 mg/dL — ABNORMAL HIGH (ref <30)

## 2015-10-09 LAB — TSH: TSH: 5.41 m[IU]/L — AB

## 2015-10-11 ENCOUNTER — Encounter: Payer: Self-pay | Admitting: Physician Assistant

## 2015-10-11 ENCOUNTER — Ambulatory Visit: Payer: Self-pay | Admitting: Physician Assistant

## 2015-10-11 VITALS — BP 116/70 | HR 86 | Temp 97.7°F | Ht 62.5 in | Wt 197.5 lb

## 2015-10-11 DIAGNOSIS — R7303 Prediabetes: Secondary | ICD-10-CM

## 2015-10-11 DIAGNOSIS — Z6835 Body mass index (BMI) 35.0-35.9, adult: Secondary | ICD-10-CM

## 2015-10-11 DIAGNOSIS — E66812 Obesity, class 2: Secondary | ICD-10-CM

## 2015-10-11 DIAGNOSIS — E669 Obesity, unspecified: Secondary | ICD-10-CM

## 2015-10-11 DIAGNOSIS — E039 Hypothyroidism, unspecified: Secondary | ICD-10-CM

## 2015-10-11 DIAGNOSIS — K219 Gastro-esophageal reflux disease without esophagitis: Secondary | ICD-10-CM

## 2015-10-11 DIAGNOSIS — I10 Essential (primary) hypertension: Secondary | ICD-10-CM

## 2015-10-11 DIAGNOSIS — Z1239 Encounter for other screening for malignant neoplasm of breast: Secondary | ICD-10-CM

## 2015-10-11 DIAGNOSIS — F17210 Nicotine dependence, cigarettes, uncomplicated: Secondary | ICD-10-CM

## 2015-10-11 DIAGNOSIS — E785 Hyperlipidemia, unspecified: Secondary | ICD-10-CM

## 2015-10-11 MED ORDER — LOVASTATIN 20 MG PO TABS: 20.0000 mg | ORAL_TABLET | Freq: Every day | ORAL | 4 refills | Status: DC

## 2015-10-11 MED ORDER — RANITIDINE HCL 300 MG PO TABS: 300.0000 mg | ORAL_TABLET | Freq: Every day | ORAL | 4 refills | Status: DC

## 2015-10-11 NOTE — Progress Notes (Signed)
BP 116/70 (BP Location: Left Arm, Patient Position: Sitting, Cuff Size: Normal)   Pulse 86   Temp 97.7 F (36.5 C)   Ht 5' 2.5" (1.588 m)   Wt 197 lb 8 oz (89.6 kg)   SpO2 98%   BMI 35.55 kg/m    Subjective:    Patient ID: Alyssa [name], female    DOB: [birth year], 60 y.o.   MRN: [id number]  HPI: Alyssa [name] is a 60 y.o. female presenting on 10/11/2015 for Follow-up   HPI   Pt cancelled mammogram appointment but says she wants one  Pt did not get back on lovastatin as instructed at [facility].  She isn't taking her protonix but says she is taking up to 6 tums daily  She says she is doing well otherwise.  She is still smoking.  Relevant past medical, surgical, family and social history reviewed and updated as indicated. Interim medical history since our last visit reviewed. Allergies and medications reviewed and updated.   Current Outpatient Prescriptions:  .  aspirin 81 MG tablet, Take 81 mg by mouth daily., Disp: , Rfl:  .  levothyroxine (SYNTHROID, LEVOTHROID) 125 MCG tablet, Take 1 tablet (125 mcg total) by mouth daily., Disp: 30 tablet, Rfl: 3 .  lisinopril-hydrochlorothiazide (PRINZIDE,ZESTORETIC) 20-25 MG tablet, TAKE ONE TABLET BY MOUTH ONCE DAILY FOR BLOOD PRESSURE, Disp: 90 tablet, Rfl: 2 .  lovastatin (MEVACOR) 20 MG tablet, Take 1 tablet (20 mg total) by mouth at bedtime. (Patient not taking: Reported on 10/11/2015), Disp: 30 tablet, Rfl: 3 .  pantoprazole (PROTONIX) 40 MG tablet, Take 40 mg by mouth daily., Disp: , Rfl:    Review of Systems  Constitutional: Positive for fatigue. Negative for appetite change, chills, diaphoresis, fever and unexpected weight change.  HENT: Negative for congestion, dental problem, drooling, ear pain, facial swelling, hearing loss, mouth sores, sneezing, sore throat, trouble swallowing and voice change.   Eyes: Negative for pain, discharge, redness, itching and visual disturbance.  Respiratory: Negative for cough, choking, shortness of  breath and wheezing.   Cardiovascular: Negative for chest pain, palpitations and leg swelling.  Gastrointestinal: Negative for abdominal pain, blood in stool, constipation, diarrhea and vomiting.  Endocrine: Negative for cold intolerance, heat intolerance and polydipsia.  Genitourinary: Negative for decreased urine volume, dysuria and hematuria.  Musculoskeletal: Negative for arthralgias, back pain and gait problem.  Skin: Negative for rash.  Allergic/Immunologic: Negative for environmental allergies.  Neurological: Positive for headaches. Negative for seizures, syncope and light-headedness.  Hematological: Negative for adenopathy.  Psychiatric/Behavioral: Negative for agitation, dysphoric mood and suicidal ideas. The patient is not nervous/anxious.     Per HPI unless specifically indicated above     Objective:    BP 116/70 (BP Location: Left Arm, Patient Position: Sitting, Cuff Size: Normal)   Pulse 86   Temp 97.7 F (36.5 C)   Ht 5' 2.5" (1.588 m)   Wt 197 lb 8 oz (89.6 kg)   SpO2 98%   BMI 35.55 kg/m   Wt Readings from Last 3 Encounters:  10/11/15 197 lb 8 oz (89.6 kg)  05/20/15 208 lb (94.3 kg)  05/12/15 210 lb (95.3 kg)    Physical Exam  Constitutional: She is oriented to person, place, and time. She appears well-developed and well-nourished.  HENT:  Head: Normocephalic and atraumatic.  Neck: Neck supple.  Cardiovascular: Normal rate and regular rhythm.   Pulmonary/Chest: Effort normal and breath sounds normal.  Abdominal: Soft. Bowel sounds are normal. She exhibits no mass.  There is no hepatosplenomegaly. There is no tenderness.  Musculoskeletal: She exhibits no edema.  Lymphadenopathy:    She has no cervical adenopathy.  Neurological: She is alert and oriented to person, place, and time.  Skin: Skin is warm and dry.  Psychiatric: She has a normal mood and affect. Her behavior is normal.  Vitals reviewed.   Results for orders placed or performed in visit on  08/11/15  COMPLETE METABOLIC PANEL WITH GFR  Result Value Ref Range   Sodium 139 135 - 146 mmol/L   Potassium 4.1 3.5 - 5.3 mmol/L   Chloride 105 98 - 110 mmol/L   CO2 24 20 - 31 mmol/L   Glucose, Bld 93 65 - 99 mg/dL   BUN 12 7 - 25 mg/dL   Creat 6.570.74 8.460.50 - 9.621.05 mg/dL   Total Bilirubin 0.4 0.2 - 1.2 mg/dL   Alkaline Phosphatase 71 33 - 130 U/L   AST 14 10 - 35 U/L   ALT 11 6 - 29 U/L   Total Protein 7.0 6.1 - 8.1 g/dL   Albumin 4.0 3.6 - 5.1 g/dL   Calcium 9.5 8.6 - 95.210.4 mg/dL   GFR, Est African American >89 >=60 mL/min   GFR, Est Non African American 89 >=60 mL/min  Lipid Profile  Result Value Ref Range   Cholesterol 220 (H) 125 - 200 mg/dL   Triglycerides 841168 (H) <150 mg/dL   HDL 33 (L) >=32>=46 mg/dL   Total CHOL/HDL Ratio 6.7 (H) <=5.0 Ratio   VLDL 34 (H) <30 mg/dL   LDL Cholesterol 440153 (H) <130 mg/dL  TSH  Result Value Ref Range   TSH 5.41 (H) mIU/L      Assessment & Plan:   Encounter Diagnoses  Name Primary?  . Gastroesophageal reflux disease, esophagitis presence not specified Yes  . Essential hypertension, benign   . Hyperlipidemia, unspecified hyperlipidemia type   . Hypothyroidism, unspecified type   . Cigarette nicotine dependence, uncomplicated   . Class 2 obesity with body mass index (BMI) of 35.0 to 35.9 in adult, unspecified obesity type, unspecified whether serious comorbidity present   . Prediabetes   . Screening for breast cancer     -reviewed labs with pt -rx ranitidine -reschedule mammogram appointment -counseled on smoking cessation -counseled pt to get back on her lovastatin for her hyperlipidemia -f/u 3 months.  RTO sooner prn

## 2015-10-11 NOTE — Patient Instructions (Addendum)
Get back on lovastatin for cholesterol  Ranitidine for reflux  We will call you with mammogram appointment    Smoking Cessation, Tips for Success If you are ready to quit smoking, congratulations! You have chosen to help yourself be healthier. Cigarettes bring nicotine, tar, carbon monoxide, and other irritants into your body. Your lungs, heart, and blood vessels will be able to work better without these poisons. There are many different ways to quit smoking. Nicotine gum, nicotine patches, a nicotine inhaler, or nicotine nasal spray can help with physical craving. Hypnosis, support groups, and medicines help break the habit of smoking. WHAT THINGS CAN I DO TO MAKE QUITTING EASIER?  Here are some tips to help you quit for good:  Pick a date when you will quit smoking completely. Tell all of your friends and family about your plan to quit on that date.  Do not try to slowly cut down on the number of cigarettes you are smoking. Pick a quit date and quit smoking completely starting on that day.  Throw away all cigarettes.   Clean and remove all ashtrays from your home, work, and car.  On a card, write down your reasons for quitting. Carry the card with you and read it when you get the urge to smoke.  Cleanse your body of nicotine. Drink enough water and fluids to keep your urine clear or pale yellow. Do this after quitting to flush the nicotine from your body.  Learn to predict your moods. Do not let a bad situation be your excuse to have a cigarette. Some situations in your life might tempt you into wanting a cigarette.  Never have "just one" cigarette. It leads to wanting another and another. Remind yourself of your decision to quit.  Change habits associated with smoking. If you smoked while driving or when feeling stressed, try other activities to replace smoking. Stand up when drinking your coffee. Brush your teeth after eating. Sit in a different chair when you read the paper. Avoid  alcohol while trying to quit, and try to drink fewer caffeinated beverages. Alcohol and caffeine may urge you to smoke.  Avoid foods and drinks that can trigger a desire to smoke, such as sugary or spicy foods and alcohol.  Ask people who smoke not to smoke around you.  Have something planned to do right after eating or having a cup of coffee. For example, plan to take a walk or exercise.  Try a relaxation exercise to calm you down and decrease your stress. Remember, you may be tense and nervous for the first 2 weeks after you quit, but this will pass.  Find new activities to keep your hands busy. Play with a pen, coin, or rubber band. Doodle or draw things on paper.  Brush your teeth right after eating. This will help cut down on the craving for the taste of tobacco after meals. You can also try mouthwash.   Use oral substitutes in place of cigarettes. Try using lemon drops, carrots, cinnamon sticks, or chewing gum. Keep them handy so they are available when you have the urge to smoke.  When you have the urge to smoke, try deep breathing.  Designate your home as a nonsmoking area.  If you are a heavy smoker, ask your health care provider about a prescription for nicotine chewing gum. It can ease your withdrawal from nicotine.  Reward yourself. Set aside the cigarette money you save and buy yourself something nice.  Look for support from others.  Join a support group or smoking cessation program. Ask someone at home or at work to help you with your plan to quit smoking.  Always ask yourself, "Do I need this cigarette or is this just a reflex?" Tell yourself, "Today, I choose not to smoke," or "I do not want to smoke." You are reminding yourself of your decision to quit.  Do not replace cigarette smoking with electronic cigarettes (commonly called e-cigarettes). The safety of e-cigarettes is unknown, and some may contain harmful chemicals.  If you relapse, do not give up! Plan ahead and  think about what you will do the next time you get the urge to smoke. HOW WILL I FEEL WHEN I QUIT SMOKING? You may have symptoms of withdrawal because your body is used to nicotine (the addictive substance in cigarettes). You may crave cigarettes, be irritable, feel very hungry, cough often, get headaches, or have difficulty concentrating. The withdrawal symptoms are only temporary. They are strongest when you first quit but will go away within 10-14 days. When withdrawal symptoms occur, stay in control. Think about your reasons for quitting. Remind yourself that these are signs that your body is healing and getting used to being without cigarettes. Remember that withdrawal symptoms are easier to treat than the major diseases that smoking can cause.  Even after the withdrawal is over, expect periodic urges to smoke. However, these cravings are generally short lived and will go away whether you smoke or not. Do not smoke! WHAT RESOURCES ARE AVAILABLE TO HELP ME QUIT SMOKING? Your health care provider can direct you to community resources or hospitals for support, which may include:  Group support.  Education.  Hypnosis.  Therapy.   This information is not intended to replace advice given to you by your health care provider. Make sure you discuss any questions you have with your health care provider.   Document Released: 09/17/2003 Document Revised: 01/09/2014 Document Reviewed: 06/06/2012 [employer] Interactive Patient Education [employer]2016 [employer]

## 2015-10-27 ENCOUNTER — Ambulatory Visit (HOSPITAL_COMMUNITY): Payer: Self-pay

## 2015-11-03 ENCOUNTER — Ambulatory Visit (HOSPITAL_COMMUNITY): Payer: Self-pay

## 2015-11-04 ENCOUNTER — Ambulatory Visit (HOSPITAL_COMMUNITY): Payer: Self-pay

## 2015-11-07 ENCOUNTER — Other Ambulatory Visit: Payer: Self-pay | Admitting: Physician Assistant

## 2015-11-13 IMAGING — CR DG CHEST 2V
2 series · 2 of 2 positions shown · non-contrast
Comparison: 12/11/2007

CLINICAL DATA: Onset of shortness of breath this morning.

EXAM:
CHEST  2 VIEW

[view not recorded (1 of 2)]
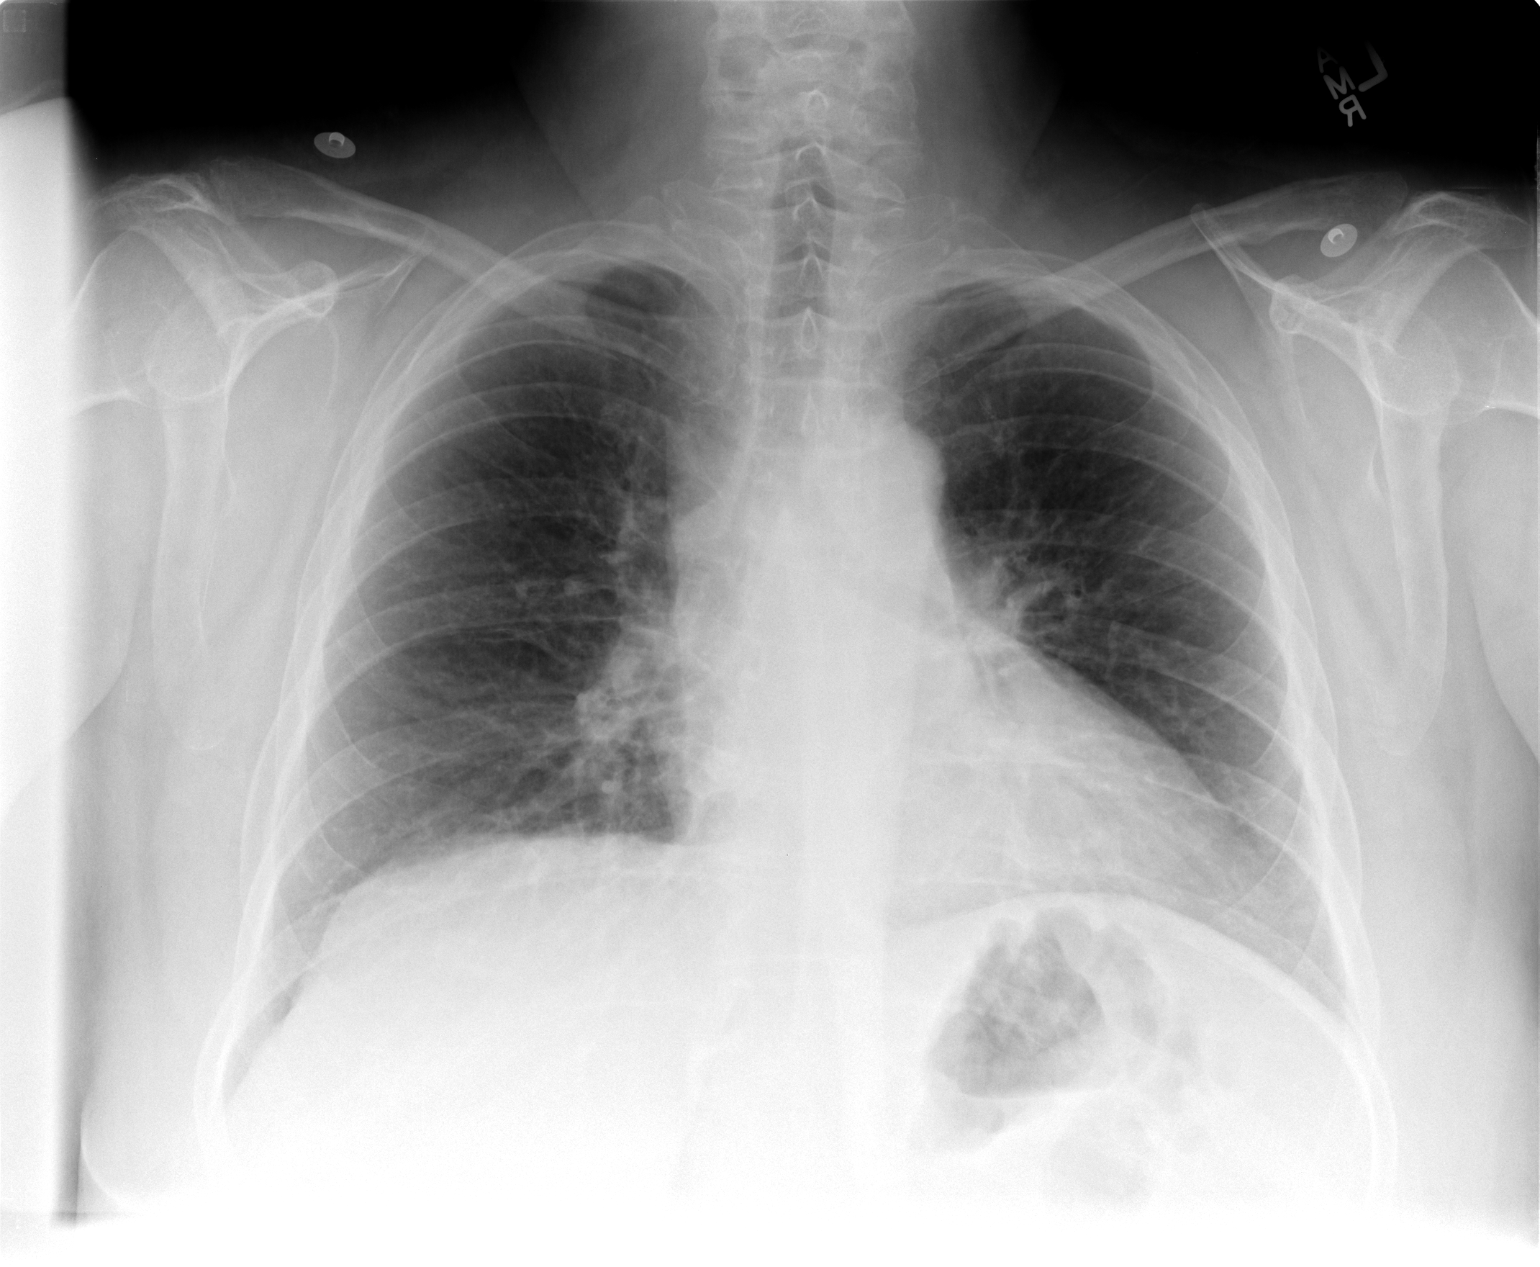

[view not recorded (2 of 2)]
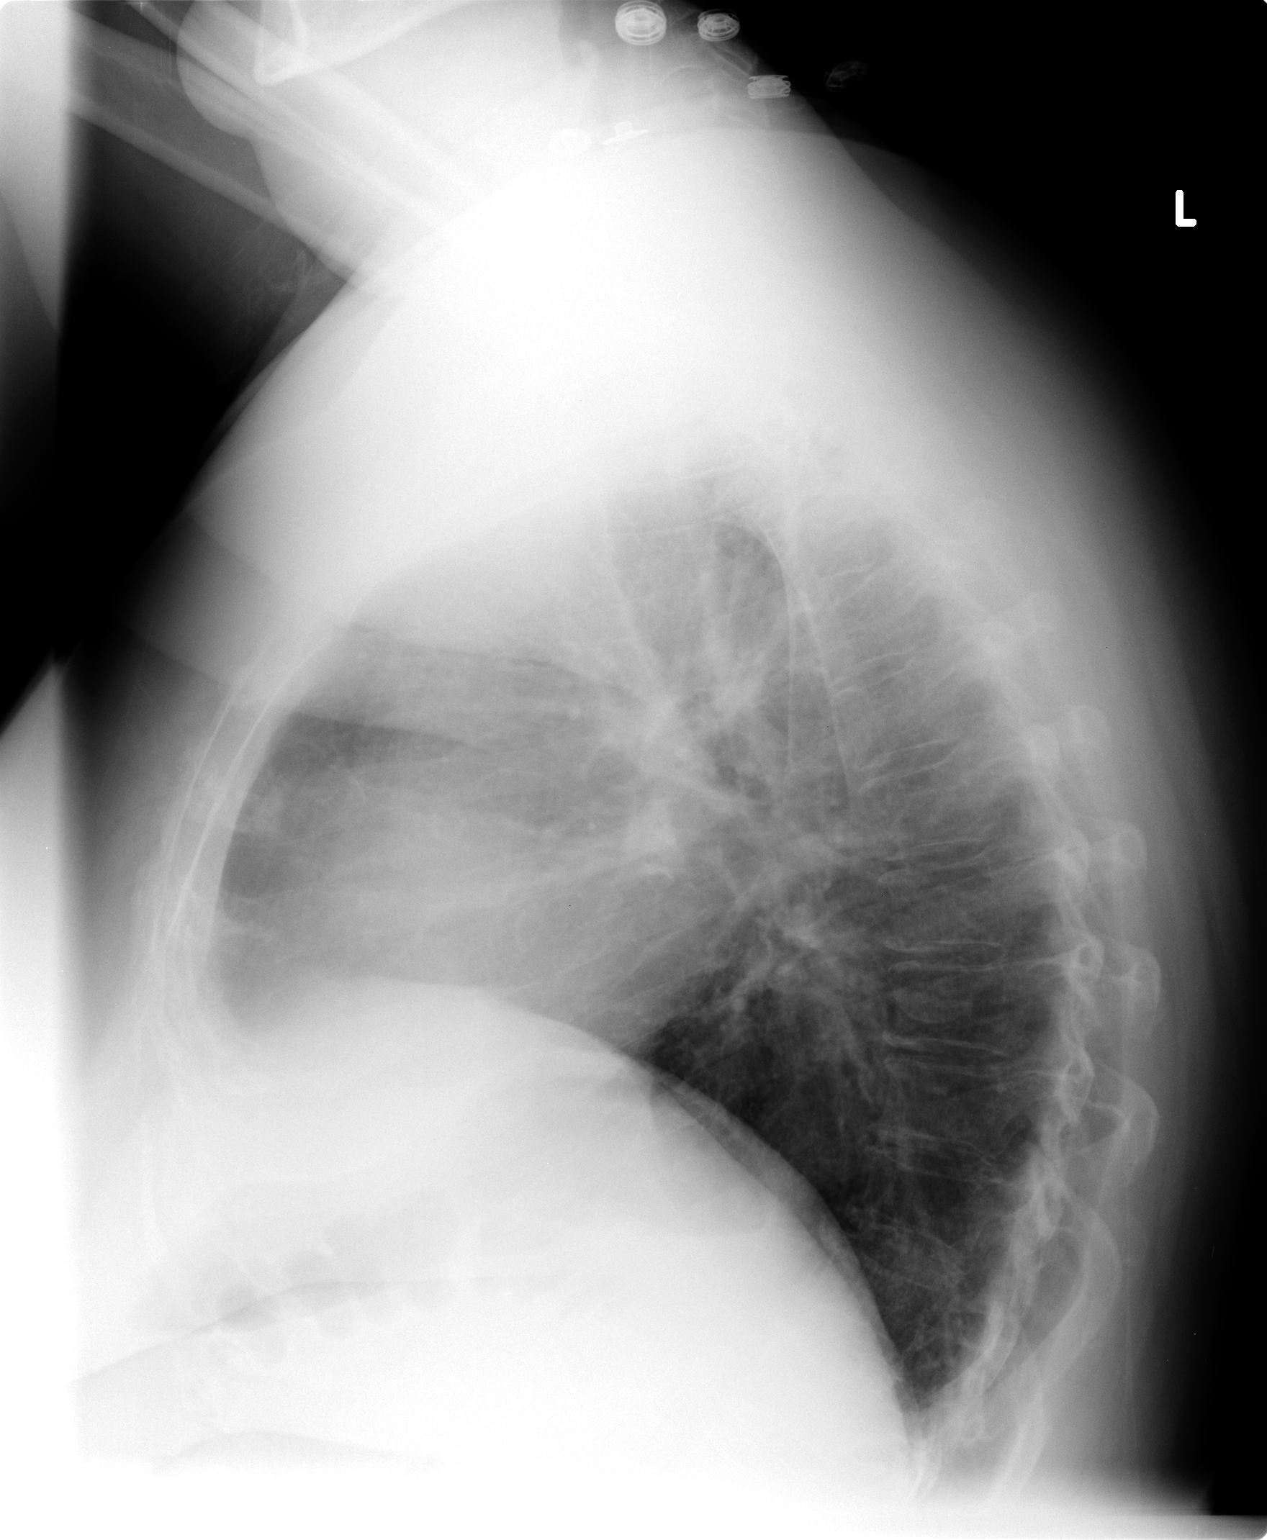

[2 of 2 positions shown; findings below may reference images not displayed]

FINDINGS: The heart size and mediastinal contours are within normal limits.
Both lungs are clear. The visualized skeletal structures are
unremarkable.
IMPRESSION: Normal exam.

## 2015-11-15 ENCOUNTER — Ambulatory Visit (HOSPITAL_COMMUNITY)
Admission: RE | Admit: 2015-11-15 | Discharge: 2015-11-15 | Disposition: A | Discharge status: Home / Self Care | Payer: Self-pay | Source: Ambulatory Visit | Attending: Physician Assistant | Admitting: Physician Assistant

## 2015-12-21 ENCOUNTER — Encounter (HOSPITAL_COMMUNITY): Payer: Self-pay | Admitting: *Deleted

## 2015-12-21 NOTE — Progress Notes (Signed)
Result letter mailed to patient concerning pap smear results. Pap smear was negative. 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 Next pap smear due in one year.

## 2016-01-06 ENCOUNTER — Other Ambulatory Visit: Payer: Self-pay

## 2016-01-06 DIAGNOSIS — E039 Hypothyroidism, unspecified: Secondary | ICD-10-CM

## 2016-01-06 DIAGNOSIS — E785 Hyperlipidemia, unspecified: Secondary | ICD-10-CM

## 2016-01-06 DIAGNOSIS — I10 Essential (primary) hypertension: Secondary | ICD-10-CM

## 2016-01-07 LAB — LIPID PANEL
CHOL/HDL RATIO: 5.7 ratio — AB (ref <5.0)
Cholesterol: 221 mg/dL — ABNORMAL HIGH (ref <200)
HDL: 39 mg/dL — AB (ref >50)
LDL Cholesterol: 153 mg/dL — ABNORMAL HIGH (ref <100)
TRIGLYCERIDES: 145 mg/dL (ref <150)
VLDL: 29 mg/dL (ref <30)

## 2016-01-07 LAB — COMPREHENSIVE METABOLIC PANEL
ALBUMIN: 4.1 g/dL (ref 3.6–5.1)
ALT: 14 U/L (ref 6–29)
AST: 13 U/L (ref 10–35)
Alkaline Phosphatase: 70 U/L (ref 33–130)
BILIRUBIN TOTAL: 0.5 mg/dL (ref 0.2–1.2)
BUN: 11 mg/dL (ref 7–25)
CALCIUM: 9.8 mg/dL (ref 8.6–10.4)
CHLORIDE: 104 mmol/L (ref 98–110)
CO2: 27 mmol/L (ref 20–31)
Creat: 0.65 mg/dL (ref 0.50–1.05)
GLUCOSE: 87 mg/dL (ref 65–99)
Potassium: 4.3 mmol/L (ref 3.5–5.3)
Sodium: 138 mmol/L (ref 135–146)
Total Protein: 7.3 g/dL (ref 6.1–8.1)

## 2016-01-07 LAB — TSH: TSH: 0.7 mIU/L

## 2016-01-12 ENCOUNTER — Ambulatory Visit: Payer: Self-pay | Admitting: Physician Assistant

## 2016-01-13 ENCOUNTER — Ambulatory Visit: Payer: Self-pay | Admitting: Physician Assistant

## 2016-01-13 ENCOUNTER — Encounter: Payer: Self-pay | Admitting: Physician Assistant

## 2016-01-13 VITALS — BP 92/60 | HR 84 | Temp 97.0°F | Ht 62.5 in | Wt 192.8 lb

## 2016-01-13 DIAGNOSIS — E785 Hyperlipidemia, unspecified: Secondary | ICD-10-CM

## 2016-01-13 DIAGNOSIS — F17210 Nicotine dependence, cigarettes, uncomplicated: Secondary | ICD-10-CM

## 2016-01-13 DIAGNOSIS — Z6834 Body mass index (BMI) 34.0-34.9, adult: Secondary | ICD-10-CM

## 2016-01-13 DIAGNOSIS — E669 Obesity, unspecified: Secondary | ICD-10-CM

## 2016-01-13 DIAGNOSIS — R7303 Prediabetes: Secondary | ICD-10-CM

## 2016-01-13 DIAGNOSIS — E039 Hypothyroidism, unspecified: Secondary | ICD-10-CM

## 2016-01-13 DIAGNOSIS — I10 Essential (primary) hypertension: Secondary | ICD-10-CM

## 2016-01-13 DIAGNOSIS — K219 Gastro-esophageal reflux disease without esophagitis: Secondary | ICD-10-CM

## 2016-01-13 MED ORDER — OMEPRAZOLE 40 MG PO CPDR: 40.0000 mg | DELAYED_RELEASE_CAPSULE | Freq: Every day | ORAL | 2 refills | Status: DC

## 2016-01-13 MED ORDER — LISINOPRIL-HYDROCHLOROTHIAZIDE 20-12.5 MG PO TABS: 1.0000 | ORAL_TABLET | Freq: Every day | ORAL | 3 refills | Status: DC

## 2016-01-13 NOTE — Progress Notes (Signed)
BP 92/60 (BP Location: Left Arm, Patient Position: Sitting, Cuff Size: Large)   Pulse 84   Temp 97 F (36.1 C) (Other (Comment))   Ht 5' 2.5" (1.588 m)   Wt 192 lb 12.8 oz (87.5 kg)   SpO2 98%   BMI 34.70 kg/m    Subjective:    Patient ID: Alyssa [name], female    DOB: June 15, 1955, 61 y.o.   MRN: [id number]  HPI: Alyssa [name] is a 60 y.o. female presenting on 01/13/2016 for Hypertension; Hyperlipidemia; and Hypothyroidism   HPI  Pt working at [employer] now.  She says she likes it.  pt only takes her lovastatin every now and then because it gives her a "blah blah" feeling.  She says it feels like it gets stuck.  None of her other medications do that.   Pt is taking tums sometimes.  She is taking her ranitidine.  Says she thinks it depends on what she eats.  States cajun chicken biscuit gives her heartburn.  Pt is still smoking but is working to cut back.    Pt is working on losing a little bit of weight.   Relevant past medical, surgical, family and social history reviewed and updated as indicated. Interim medical history since our last visit reviewed. Allergies and medications reviewed and updated.   Current Outpatient Prescriptions:  .  aspirin 81 MG tablet, Take 81 mg by mouth daily., Disp: , Rfl:  .  levothyroxine (SYNTHROID, LEVOTHROID) 125 MCG tablet, TAKE ONE TABLET BY MOUTH ONCE DAILY, Disp: 30 tablet, Rfl: 3 .  lisinopril-hydrochlorothiazide (PRINZIDE,ZESTORETIC) 20-25 MG tablet, TAKE ONE TABLET BY MOUTH ONCE DAILY FOR BLOOD PRESSURE, Disp: 90 tablet, Rfl: 2 .  lovastatin (MEVACOR) 20 MG tablet, Take 1 tablet (20 mg total) by mouth at bedtime., Disp: 30 tablet, Rfl: 4 .  ranitidine (ZANTAC) 300 MG tablet, Take 1 tablet (300 mg total) by mouth at bedtime., Disp: 30 tablet, Rfl: 4   Review of Systems  Constitutional: Negative for appetite change, chills, diaphoresis, fatigue, fever and unexpected weight change.  HENT: Negative for congestion, dental problem,  drooling, ear pain, facial swelling, hearing loss, mouth sores, sneezing, sore throat, trouble swallowing and voice change.   Eyes: Negative for pain, discharge, redness, itching and visual disturbance.  Respiratory: Negative for cough, choking, shortness of breath and wheezing.   Cardiovascular: Negative for chest pain, palpitations and leg swelling.  Gastrointestinal: Negative for abdominal pain, blood in stool, constipation, diarrhea and vomiting.  Endocrine: Negative for cold intolerance, heat intolerance and polydipsia.  Genitourinary: Negative for decreased urine volume, dysuria and hematuria.  Musculoskeletal: Negative for arthralgias, back pain and gait problem.  Skin: Negative for rash.  Allergic/Immunologic: Negative for environmental allergies.  Neurological: Negative for seizures, syncope, light-headedness and headaches.  Hematological: Negative for adenopathy.  Psychiatric/Behavioral: Negative for agitation, dysphoric mood and suicidal ideas. The patient is not nervous/anxious.     Per HPI unless specifically indicated above     Objective:    BP 92/60 (BP Location: Left Arm, Patient Position: Sitting, Cuff Size: Large)   Pulse 84   Temp 97 F (36.1 C) (Other (Comment))   Ht 5' 2.5" (1.588 m)   Wt 192 lb 12.8 oz (87.5 kg)   SpO2 98%   BMI 34.70 kg/m   Wt Readings from Last 3 Encounters:  01/13/16 192 lb 12.8 oz (87.5 kg)  10/11/15 197 lb 8 oz (89.6 kg)  05/20/15 208 lb (94.3 kg)    Physical Exam  Constitutional: She is oriented to person, place, and time. She appears well-developed and well-nourished.  HENT:  Head: Normocephalic and atraumatic.  Neck: Neck supple.  Cardiovascular: Normal rate and regular rhythm.   Pulmonary/Chest: Effort normal and breath sounds normal.  Abdominal: Soft. Bowel sounds are normal. She exhibits no mass. There is no hepatosplenomegaly. There is no tenderness.  Musculoskeletal: She exhibits no edema.  Lymphadenopathy:    She has no  cervical adenopathy.  Neurological: She is alert and oriented to person, place, and time.  Skin: Skin is warm and dry.  Psychiatric: She has a normal mood and affect. Her behavior is normal.  Vitals reviewed.   Results for orders placed or performed in visit on 01/06/16  TSH  Result Value Ref Range   TSH 0.70 mIU/L  Comprehensive Metabolic Panel (CMET)  Result Value Ref Range   Sodium 138 135 - 146 mmol/L   Potassium 4.3 3.5 - 5.3 mmol/L   Chloride 104 98 - 110 mmol/L   CO2 27 20 - 31 mmol/L   Glucose, Bld 87 65 - 99 mg/dL   BUN 11 7 - 25 mg/dL   Creat 3.240.65 4.010.50 - 0.271.05 mg/dL   Total Bilirubin 0.5 0.2 - 1.2 mg/dL   Alkaline Phosphatase 70 33 - 130 U/L   AST 13 10 - 35 U/L   ALT 14 6 - 29 U/L   Total Protein 7.3 6.1 - 8.1 g/dL   Albumin 4.1 3.6 - 5.1 g/dL   Calcium 9.8 8.6 - 25.310.4 mg/dL  Lipid Profile  Result Value Ref Range   Cholesterol 221 (H) <200 mg/dL   Triglycerides 664145 <403<150 mg/dL   HDL 39 (L) >47>50 mg/dL   Total CHOL/HDL Ratio 5.7 (H) <5.0 Ratio   VLDL 29 <30 mg/dL   LDL Cholesterol 425153 (H) <100 mg/dL      Assessment & Plan:   Encounter Diagnoses  Name Primary?  . Essential hypertension, benign Yes  . Gastroesophageal reflux disease, esophagitis presence not specified   . Hyperlipidemia, unspecified hyperlipidemia type   . Hypothyroidism, unspecified type   . Cigarette nicotine dependence, uncomplicated   . Prediabetes   . Class 1 obesity with body mass index (BMI) of 34.0 to 34.9 in adult, unspecified obesity type, unspecified whether serious comorbidity present     -reviewed labs with pt -will get pt Signed up for medassist and order omeprazole.  Discussed with pt that she needs 3 months of ppi to try to heal up globus sensation.  Will need to go to GI if fails to resolve.  Pt states understanding -counseled pt to take her statin every day to help her lipids.  Recommended she follow her pill with full 8 ounce glass of water -will Reduce lisinopril / hctz to  20/12.5 due to her BP going down, likely due to weight loss -counseled smoking cessation and discussed that smoking worsens GERD -follow up one month to recheck BP

## 2016-01-13 NOTE — Patient Instructions (Signed)
Continue ranitidine until omeprazole comes in the mail (for reflux)  Take lovastatin every day (for your cholesterol)

## 2016-01-23 ENCOUNTER — Emergency Department (HOSPITAL_COMMUNITY)
Admission: EM | Admit: 2016-01-23 | Discharge: 2016-01-23 | Disposition: A | Discharge status: Home / Self Care | Payer: Self-pay | Attending: Emergency Medicine | Admitting: Emergency Medicine

## 2016-01-23 ENCOUNTER — Encounter (HOSPITAL_COMMUNITY): Payer: Self-pay | Admitting: Emergency Medicine

## 2016-01-23 DIAGNOSIS — F17210 Nicotine dependence, cigarettes, uncomplicated: Secondary | ICD-10-CM | POA: Insufficient documentation

## 2016-01-23 DIAGNOSIS — Z7982 Long term (current) use of aspirin: Secondary | ICD-10-CM | POA: Insufficient documentation

## 2016-01-23 DIAGNOSIS — R112 Nausea with vomiting, unspecified: Secondary | ICD-10-CM

## 2016-01-23 DIAGNOSIS — E860 Dehydration: Secondary | ICD-10-CM | POA: Insufficient documentation

## 2016-01-23 DIAGNOSIS — Z7984 Long term (current) use of oral hypoglycemic drugs: Secondary | ICD-10-CM | POA: Insufficient documentation

## 2016-01-23 DIAGNOSIS — E119 Type 2 diabetes mellitus without complications: Secondary | ICD-10-CM | POA: Insufficient documentation

## 2016-01-23 DIAGNOSIS — R197 Diarrhea, unspecified: Secondary | ICD-10-CM

## 2016-01-23 DIAGNOSIS — I10 Essential (primary) hypertension: Secondary | ICD-10-CM | POA: Insufficient documentation

## 2016-01-23 DIAGNOSIS — Z79899 Other long term (current) drug therapy: Secondary | ICD-10-CM | POA: Insufficient documentation

## 2016-01-23 LAB — COMPREHENSIVE METABOLIC PANEL
ALBUMIN: 3.7 g/dL (ref 3.5–5.0)
ALT: 17 U/L (ref 14–54)
AST: 19 U/L (ref 15–41)
Alkaline Phosphatase: 65 U/L (ref 38–126)
Anion gap: 9 (ref 5–15)
BUN: 12 mg/dL (ref 6–20)
CHLORIDE: 102 mmol/L (ref 101–111)
CO2: 23 mmol/L (ref 22–32)
CREATININE: 0.68 mg/dL (ref 0.44–1.00)
Calcium: 9 mg/dL (ref 8.9–10.3)
GFR calc non Af Amer: >60 mL/min (ref >60)
GLUCOSE: 99 mg/dL (ref 65–99)
Potassium: 3.4 mmol/L — ABNORMAL LOW (ref 3.5–5.1)
SODIUM: 134 mmol/L — AB (ref 135–145)
Total Bilirubin: 0.6 mg/dL (ref 0.3–1.2)
Total Protein: 7 g/dL (ref 6.5–8.1)

## 2016-01-23 LAB — CBC
HCT: 37 % (ref 36.0–46.0)
Hemoglobin: 12.7 g/dL (ref 12.0–15.0)
MCH: 32 pg (ref 26.0–34.0)
MCHC: 34.3 g/dL (ref 30.0–36.0)
MCV: 93.2 fL (ref 78.0–100.0)
PLATELETS: 300 10*3/uL (ref 150–400)
RBC: 3.97 MIL/uL (ref 3.87–5.11)
RDW: 12.7 % (ref 11.5–15.5)
WBC: 7 10*3/uL (ref 4.0–10.5)

## 2016-01-23 LAB — CBG MONITORING, ED: GLUCOSE-CAPILLARY: 94 mg/dL (ref 65–99)

## 2016-01-23 LAB — LIPASE, BLOOD: Lipase: 21 U/L (ref 11–51)

## 2016-01-23 MED ORDER — ONDANSETRON 8 MG PO TBDP: 8.0000 mg | ORAL_TABLET | Freq: Three times a day (TID) | ORAL | 0 refills | Status: DC | PRN

## 2016-01-23 MED ORDER — KETOROLAC TROMETHAMINE 30 MG/ML IJ SOLN
30.0000 mg | Freq: Once | INTRAMUSCULAR | Status: AC
  Administered 2016-01-23: 30 mg via INTRAVENOUS
  Filled 2016-01-23: qty 1

## 2016-01-23 MED ORDER — SODIUM CHLORIDE 0.9 % IV BOLUS (SEPSIS)
2000.0000 mL | Freq: Once | INTRAVENOUS | Status: AC
  Administered 2016-01-23: 2000 mL via INTRAVENOUS

## 2016-01-23 MED ORDER — ONDANSETRON HCL 4 MG/2ML IJ SOLN
4.0000 mg | Freq: Once | INTRAMUSCULAR | Status: AC
  Administered 2016-01-23: 4 mg via INTRAVENOUS
  Filled 2016-01-23: qty 2

## 2016-01-23 NOTE — ED Triage Notes (Signed)
N/v/d with headache since last night. A/o. Mm wet. Nad.

## 2016-01-23 NOTE — ED Provider Notes (Signed)
[facility]-EMERGENCY DEPT Provider Note   CSN: [id number] Arrival date & time: 01/23/16  [id number]   By signing my name below, I, Bobbie Stackhristopher Reid, attest that this documentation has been prepared under the direction and in the presence of Azalia BilisKevin Cadi Rhinehart, MD. Electronically Signed: Bobbie Stackhristopher Reid, Scribe. 01/23/16. 8:34 AM. History   Chief Complaint Chief Complaint  Patient presents with  . Emesis  . Diarrhea      The history is provided by the patient. No language interpreter was used.   HPI Comments: Alyssa [name] is a 61 y.o. female who presents to the Emergency Department complaining of vomiting since yesterday. She states that she came home yesterday and ate a biscuit and the vomiting began shortly after. She reports vomiting throughout the entire night. She denies blood in vomit. She also has an associated cough, nausea, diarrhea, and left lower quadrant abdominal pain. She has not taken anything to relieve any of her symptoms because she has not been able to keep anything down. She states that she has been unable to ambulate as well. She denies any sick contact. She had diverticulitis in the past but states that this pain feels different. She denies dysuria and increased urinary frequency.    Past Medical History:  Diagnosis Date  . Diabetes mellitus without complication (HCC)   . GERD (gastroesophageal reflux disease)   . Hyperlipidemia   . Hypertension   . Thyroid disease     Patient Active Problem List   Diagnosis Date Noted  . Esophageal reflux 05/15/2015  . Prediabetes 01/12/2015  . Thyroid activity decreased 01/12/2015  . Hyperlipidemia 11/29/2014  . Essential hypertension, benign 11/29/2014  . Cigarette nicotine dependence, uncomplicated 11/29/2014  . Morbid obesity (HCC) 11/29/2014    Past Surgical History:  Procedure Laterality Date  . CARPAL TUNNEL RELEASE    . TUBAL LIGATION      OB History    Gravida Para Term Preterm AB Living   2 2 2     2    SAB TAB  Ectopic Multiple Live Births                   Home Medications    Prior to Admission medications   Medication Sig Start Date End Date Taking? Authorizing Provider  aspirin 81 MG tablet Take 81 mg by mouth daily.    Historical Provider, MD  levothyroxine (SYNTHROID, LEVOTHROID) 125 MCG tablet TAKE ONE TABLET BY MOUTH ONCE DAILY 11/07/15   Jacquelin HawkingShannon McElroy, PA-C  lisinopril-hydrochlorothiazide (ZESTORETIC) 20-12.5 MG tablet Take 1 tablet by mouth daily. 01/13/16   Jacquelin HawkingShannon McElroy, PA-C  lovastatin (MEVACOR) 20 MG tablet Take 1 tablet (20 mg total) by mouth at bedtime. 10/11/15   Jacquelin HawkingShannon McElroy, PA-C  omeprazole (PRILOSEC) 40 MG capsule Take 1 capsule (40 mg total) by mouth daily. 01/13/16   Jacquelin HawkingShannon McElroy, PA-C  ranitidine (ZANTAC) 300 MG tablet Take 1 tablet (300 mg total) by mouth at bedtime. 10/11/15   Jacquelin HawkingShannon McElroy, PA-C    Family History Family History  Problem Relation Age of Onset  . Alzheimer's disease Mother   . Alcohol abuse Father     Social History Social History  Substance Use Topics  . Smoking status: Current Every Day Smoker    Packs/day: 0.50    Years: 42.00    Types: Cigarettes  . Smokeless tobacco: Never Used  . Alcohol use No     Allergies   Sulfa antibiotics   Review of Systems Review of Systems A complete 10 system  review of systems was obtained and all systems are negative except as noted in the HPI and PMH.    Physical Exam Updated Vital Signs BP 122/62 (BP Location: Right Arm)   Pulse 75   Temp 98.2 F (36.8 C) (Oral)   Resp 19   Ht 5\' 2"  (1.575 m)   Wt 192 lb (87.1 kg)   SpO2 99%   BMI 35.12 kg/m   Physical Exam  Constitutional: She is oriented to person, place, and time. She appears well-developed and well-nourished. No distress.  HENT:  Head: Normocephalic and atraumatic.  Eyes: EOM are normal.  Neck: Normal range of motion.  Cardiovascular: Normal rate, regular rhythm and normal heart sounds.   Pulmonary/Chest: Effort normal  and breath sounds normal.  Abdominal: Soft. She exhibits no distension. There is no tenderness.  Musculoskeletal: Normal range of motion.  Neurological: She is alert and oriented to person, place, and time.  Skin: Skin is warm and dry.  Psychiatric: She has a normal mood and affect. Judgment normal.  Nursing note and vitals reviewed.    ED Treatments / Results  DIAGNOSTIC STUDIES: Oxygen Saturation is 99% on RA, normal by my interpretation.    COORDINATION OF CARE: 8:15 AM Discussed treatment plan with pt at bedside and pt agreed to plan.  Labs (all labs ordered are listed, but only abnormal results are displayed) Labs Reviewed  COMPREHENSIVE METABOLIC PANEL - Abnormal; Notable for the following:       Result Value   Sodium 134 (*)    Potassium 3.4 (*)    All other components within normal limits  CBC  LIPASE, BLOOD  CBG MONITORING, ED    EKG  EKG Interpretation None       Radiology No results found.  Procedures Procedures (including critical care time)  Medications Ordered in ED Medications  sodium chloride 0.9 % bolus 2,000 mL (2,000 mLs Intravenous New Bag/Given 01/23/16 0834)  ondansetron (ZOFRAN) injection 4 mg (4 mg Intravenous Given 01/23/16 0834)  ketorolac (TORADOL) 30 MG/ML injection 30 mg (30 mg Intravenous Given 01/23/16 0834)     Initial Impression / Assessment and Plan / ED Course  I have reviewed the triage vital signs and the nursing notes.  Pertinent labs & imaging results that were available during my care of the patient were reviewed by me and considered in my medical decision making (see chart for details).     9:56 AM Patient feels much better at this time.  Labs without significant abnormality.  Repeat abdominal exam without tenderness.  Suspect viral illness with dehydration.  Headache resolved.  Final Clinical Impressions(s) / ED Diagnoses   Final diagnoses:  Dehydration  Nausea vomiting and diarrhea    New Prescriptions New  Prescriptions   ONDANSETRON (ZOFRAN ODT) 8 MG DISINTEGRATING TABLET    Take 1 tablet (8 mg total) by mouth every 8 (eight) hours as needed for nausea or vomiting.   I personally performed the services described in this documentation, which was scribed in my presence. The recorded information has been reviewed and is accurate.        Azalia Bilis, MD 01/23/16 [phone]

## 2016-01-24 ENCOUNTER — Encounter: Payer: Self-pay | Admitting: Physician Assistant

## 2016-01-24 ENCOUNTER — Ambulatory Visit: Payer: Self-pay | Admitting: Physician Assistant

## 2016-01-24 VITALS — BP 124/76 | HR 70 | Temp 97.7°F | Ht 62.5 in | Wt 192.8 lb

## 2016-01-24 DIAGNOSIS — R197 Diarrhea, unspecified: Secondary | ICD-10-CM

## 2016-01-24 NOTE — Progress Notes (Signed)
BP 124/76 (BP Location: Left Arm, Patient Position: Sitting, Cuff Size: Normal)   Pulse 70   Temp 97.7 F (36.5 C)   Ht 5' 2.5" (1.588 m)   Wt 192 lb 12 oz (87.4 kg)   SpO2 98%   BMI 34.69 kg/m    Subjective:    Patient ID: Alyssa [name], female    DOB: 12/09/1955, 61 y.o.   MRN: [id number]  HPI: Alyssa [name] is a 61 y.o. female presenting on 01/24/2016 for Nausea (started Friday. pt last vomited on Sat. last diarrhea episode early this morning. pt went to [facility] ER. pt hasn't taken meds since Friday)   HPI   Chief Complaint  Patient presents with  . Nausea    started Friday. pt last vomited on Sat. last diarrhea episode early this morning. pt went to [facility] ER. pt hasn't taken meds since Friday    Pt didn't get zofran rx filled b/c she is not having any more nausea.  She does have an appetite.  States 6 episodes diarrhea yesterday.  States she hasn't tried to eat since biscuit on Friday.  She says she is sipping on diet mt dew and water.  Says the water made her have diarrhea.   Diarrhea didn't start until saturday  No antibiotics, no travel, no camping.   Labs from ER visit reviewed.  Relevant past medical, surgical, family and social history reviewed and updated as indicated. Interim medical history since our last visit reviewed. Allergies and medications reviewed and updated.   Current Outpatient Prescriptions:  .  aspirin 81 MG tablet, Take 81 mg by mouth daily., Disp: , Rfl:  .  levothyroxine (SYNTHROID, LEVOTHROID) 125 MCG tablet, TAKE ONE TABLET BY MOUTH ONCE DAILY (Patient not taking: Reported on 01/24/2016), Disp: 30 tablet, Rfl: 3 .  lisinopril-hydrochlorothiazide (ZESTORETIC) 20-12.5 MG tablet, Take 1 tablet by mouth daily. (Patient not taking: Reported on 01/24/2016), Disp: 30 tablet, Rfl: 3 .  lovastatin (MEVACOR) 20 MG tablet, Take 1 tablet (20 mg total) by mouth at bedtime. (Patient not taking: Reported on 01/24/2016), Disp: 30 tablet, Rfl: 4 .  omeprazole (PRILOSEC) 40  MG capsule, Take 1 capsule (40 mg total) by mouth daily. (Patient not taking: Reported on 01/24/2016), Disp: 90 capsule, Rfl: 2 .  ondansetron (ZOFRAN ODT) 8 MG disintegrating tablet, Take 1 tablet (8 mg total) by mouth every 8 (eight) hours as needed for nausea or vomiting. (Patient not taking: Reported on 01/24/2016), Disp: 10 tablet, Rfl: 0 .  ranitidine (ZANTAC) 300 MG tablet, Take 1 tablet (300 mg total) by mouth at bedtime. (Patient not taking: Reported on 01/24/2016), Disp: 30 tablet, Rfl: 4   Review of Systems  Constitutional: Positive for appetite change. Negative for chills, diaphoresis, fatigue, fever and unexpected weight change.  HENT: Negative for congestion, dental problem, drooling, ear pain, facial swelling, hearing loss, mouth sores, sneezing, sore throat, trouble swallowing and voice change.   Eyes: Positive for visual disturbance. Negative for pain, discharge, redness and itching.  Respiratory: Negative for cough, choking, shortness of breath and wheezing.   Cardiovascular: Negative for chest pain, palpitations and leg swelling.  Gastrointestinal: Positive for abdominal pain, diarrhea and vomiting. Negative for blood in stool and constipation.  Endocrine: Negative for cold intolerance, heat intolerance and polydipsia.  Genitourinary: Negative for decreased urine volume, dysuria and hematuria.  Musculoskeletal: Negative for arthralgias, back pain and gait problem.  Skin: Negative for rash.  Allergic/Immunologic: Negative for environmental allergies.  Neurological: Positive for light-headedness and  headaches. Negative for seizures and syncope.  Hematological: Negative for adenopathy.  Psychiatric/Behavioral: Negative for agitation, dysphoric mood and suicidal ideas. The patient is not nervous/anxious.     Per HPI unless specifically indicated above     Objective:    BP 124/76 (BP Location: Left Arm, Patient Position: Sitting, Cuff Size: Normal)   Pulse 70   Temp 97.7 F  (36.5 C)   Ht 5' 2.5" (1.588 m)   Wt 192 lb 12 oz (87.4 kg)   SpO2 98%   BMI 34.69 kg/m   Wt Readings from Last 3 Encounters:  01/24/16 192 lb 12 oz (87.4 kg)  01/23/16 192 lb (87.1 kg)  01/13/16 192 lb 12.8 oz (87.5 kg)   Orthostatic VS for the past 24 hrs:  BP- Lying Pulse- Lying BP- Sitting Pulse- Sitting BP- Standing at 0 minutes Pulse- Standing at 0 minutes  01/24/16 1207 146/88 64 120/72 67 146/90 78        Physical Exam  Constitutional: She is oriented to person, place, and time. She appears well-developed and well-nourished.  HENT:  Head: Normocephalic and atraumatic.  Neck: Neck supple.  Cardiovascular: Normal rate and regular rhythm.   Pulmonary/Chest: Effort normal and breath sounds normal.  Abdominal: Soft. Bowel sounds are normal. She exhibits no mass. There is no hepatosplenomegaly. There is no tenderness.  Musculoskeletal: She exhibits no edema.  Lymphadenopathy:    She has no cervical adenopathy.  Neurological: She is alert and oriented to person, place, and time.  Skin: Skin is warm and dry.  Psychiatric: She has a normal mood and affect. Her behavior is normal.  Vitals reviewed.   Results for orders placed or performed during the hospital encounter of 01/23/16  CBC  Result Value Ref Range   WBC 7.0 4.0 - 10.5 K/uL   RBC 3.97 3.87 - 5.11 MIL/uL   Hemoglobin 12.7 12.0 - 15.0 g/dL   HCT 78.2 95.6 - 21.3 %   MCV 93.2 78.0 - 100.0 fL   MCH 32.0 26.0 - 34.0 pg   MCHC 34.3 30.0 - 36.0 g/dL   RDW 08.6 57.8 - 46.9 %   Platelets 300 150 - 400 K/uL  Comprehensive metabolic panel  Result Value Ref Range   Sodium 134 (L) 135 - 145 mmol/L   Potassium 3.4 (L) 3.5 - 5.1 mmol/L   Chloride 102 101 - 111 mmol/L   CO2 23 22 - 32 mmol/L   Glucose, Bld 99 65 - 99 mg/dL   BUN 12 6 - 20 mg/dL   Creatinine, Ser 6.29 0.44 - 1.00 mg/dL   Calcium 9.0 8.9 - 52.8 mg/dL   Total Protein 7.0 6.5 - 8.1 g/dL   Albumin 3.7 3.5 - 5.0 g/dL   AST 19 15 - 41 U/L   ALT 17 14 -  54 U/L   Alkaline Phosphatase 65 38 - 126 U/L   Total Bilirubin 0.6 0.3 - 1.2 mg/dL   GFR calc non Af Amer >60 >60 mL/min   GFR calc Af Amer >60 >60 mL/min   Anion gap 9 5 - 15  Lipase, blood  Result Value Ref Range   Lipase 21 11 - 51 U/L  POC CBG, ED  Result Value Ref Range   Glucose-Capillary 94 65 - 99 mg/dL      Assessment & Plan:   Encounter Diagnosis  Name Primary?  . Diarrhea, unspecified type Yes     -Check bmp today -encouraged bland diet and gave handout -push fluids -recommended  she take otc imodium.   -pt is to RTO if she continues to have diarrhea for 48 hours (after stopping the imodium) or sooner for any worsening or new symptoms

## 2016-01-24 NOTE — Patient Instructions (Addendum)
Imodium (Loperamide) for diarrhea Get blood drawn today Push fluids- water, diet ginger ale Bland diet- gently advance as tolerated Return to Office if still having diarrhea Wednesday    Bland Diet Introduction A bland diet consists of foods that do not have a lot of fat or fiber. Foods without fat or fiber are easier for the body to digest. They are also less likely to irritate your mouth, throat, stomach, and other parts of your gastrointestinal tract. A bland diet is sometimes called a BRAT diet. What is my plan? Your health care provider or dietitian may recommend specific changes to your diet to prevent and treat your symptoms, such as:  Eating small meals often.  Cooking food until it is soft enough to chew easily.  Chewing your food well.  Drinking fluids slowly.  Not eating foods that are very spicy, sour, or fatty.  Not eating citrus fruits, such as oranges and grapefruit. What do I need to know about this diet?  Eat a variety of foods from the bland diet food list.  Do not follow a bland diet longer than you have to.  Ask your health care provider whether you should take vitamins. What foods can I eat? Grains  Hot cereals, such as cream of wheat. Bread, crackers, or tortillas made from refined white flour. Rice. Vegetables  Canned or cooked vegetables. Mashed or boiled potatoes. Fruits  Bananas. Applesauce. Other types of cooked or canned fruit with the skin and seeds removed, such as canned peaches or pears. Meats and Other Protein Sources  Scrambled eggs. Creamy peanut butter or other nut butters. Lean, well-cooked meats, such as chicken or fish. Tofu. Soups or broths. Dairy  Low-fat dairy products, such as milk, cottage cheese, or yogurt. Beverages  Water. Herbal tea. Apple juice. Sweets and Desserts  Pudding. Custard. Fruit gelatin. Ice cream. Fats and Oils  Mild salad dressings. Canola or olive oil. The items listed above may not be a complete list of  allowed foods or beverages. Contact your dietitian for more options.  What foods are not recommended? Foods and ingredients that are often not recommended include:  Spicy foods, such as hot sauce or salsa.  Fried foods.  Sour foods, such as pickled or fermented foods.  Raw vegetables or fruits, especially citrus or berries.  Caffeinated drinks.  Alcohol.  Strongly flavored seasonings or condiments. The items listed above may not be a complete list of foods and beverages that are not allowed. Contact your dietitian for more information.  This information is not intended to replace advice given to you by your health care provider. Make sure you discuss any questions you have with your health care provider. Document Released: 04/12/2015 Document Revised: 05/27/2015 Document Reviewed: 12/31/2013  2017 [employer]

## 2016-01-25 LAB — BASIC METABOLIC PANEL
BUN: 10 mg/dL (ref 7–25)
CALCIUM: 9.2 mg/dL (ref 8.6–10.4)
CO2: 18 mmol/L — ABNORMAL LOW (ref 20–31)
Chloride: 108 mmol/L (ref 98–110)
Creat: 0.74 mg/dL (ref 0.50–1.05)
GLUCOSE: 64 mg/dL — AB (ref 65–99)
Potassium: 3.9 mmol/L (ref 3.5–5.3)
SODIUM: 139 mmol/L (ref 135–146)

## 2016-02-17 ENCOUNTER — Ambulatory Visit: Payer: Self-pay | Admitting: Physician Assistant

## 2016-03-02 ENCOUNTER — Encounter: Payer: Self-pay | Admitting: Physician Assistant

## 2016-04-04 ENCOUNTER — Other Ambulatory Visit: Payer: Self-pay | Admitting: Physician Assistant

## 2016-05-08 ENCOUNTER — Other Ambulatory Visit: Payer: Self-pay | Admitting: Physician Assistant

## 2016-05-16 ENCOUNTER — Encounter: Payer: Self-pay | Admitting: Physician Assistant

## 2016-05-16 ENCOUNTER — Other Ambulatory Visit: Payer: Self-pay | Admitting: Physician Assistant

## 2016-05-16 ENCOUNTER — Ambulatory Visit: Payer: Self-pay | Admitting: Physician Assistant

## 2016-05-16 ENCOUNTER — Other Ambulatory Visit (HOSPITAL_COMMUNITY)
Admission: RE | Admit: 2016-05-16 | Discharge: 2016-05-16 | Disposition: A | Discharge status: Home / Self Care | Payer: Self-pay | Source: Ambulatory Visit | Attending: Physician Assistant | Admitting: Physician Assistant

## 2016-05-16 VITALS — BP 100/70 | HR 68 | Temp 97.9°F | Ht 62.5 in | Wt 192.5 lb

## 2016-05-16 DIAGNOSIS — K219 Gastro-esophageal reflux disease without esophagitis: Secondary | ICD-10-CM

## 2016-05-16 DIAGNOSIS — I10 Essential (primary) hypertension: Secondary | ICD-10-CM

## 2016-05-16 DIAGNOSIS — Z1211 Encounter for screening for malignant neoplasm of colon: Secondary | ICD-10-CM

## 2016-05-16 DIAGNOSIS — F17210 Nicotine dependence, cigarettes, uncomplicated: Secondary | ICD-10-CM

## 2016-05-16 DIAGNOSIS — E039 Hypothyroidism, unspecified: Secondary | ICD-10-CM

## 2016-05-16 DIAGNOSIS — E785 Hyperlipidemia, unspecified: Secondary | ICD-10-CM

## 2016-05-16 DIAGNOSIS — R7303 Prediabetes: Secondary | ICD-10-CM

## 2016-05-16 DIAGNOSIS — E669 Obesity, unspecified: Secondary | ICD-10-CM

## 2016-05-16 LAB — COMPREHENSIVE METABOLIC PANEL
ALT: 19 U/L (ref 14–54)
AST: 19 U/L (ref 15–41)
Albumin: 4.1 g/dL (ref 3.5–5.0)
Alkaline Phosphatase: 85 U/L (ref 38–126)
Anion gap: 9 (ref 5–15)
BUN: 13 mg/dL (ref 6–20)
CHLORIDE: 106 mmol/L (ref 101–111)
CO2: 22 mmol/L (ref 22–32)
CREATININE: 0.71 mg/dL (ref 0.44–1.00)
Calcium: 9.7 mg/dL (ref 8.9–10.3)
GFR calc Af Amer: >60 mL/min (ref >60)
GFR calc non Af Amer: >60 mL/min (ref >60)
Glucose, Bld: 95 mg/dL (ref 65–99)
Potassium: 4 mmol/L (ref 3.5–5.1)
SODIUM: 137 mmol/L (ref 135–145)
Total Bilirubin: 0.4 mg/dL (ref 0.3–1.2)
Total Protein: 7.7 g/dL (ref 6.5–8.1)

## 2016-05-16 LAB — TSH: TSH: 0.96 u[IU]/mL (ref 0.350–4.500)

## 2016-05-16 LAB — LIPID PANEL
CHOL/HDL RATIO: 5.8 ratio
Cholesterol: 222 mg/dL — ABNORMAL HIGH (ref 0–200)
HDL: 38 mg/dL — ABNORMAL LOW (ref >40)
LDL CALC: 155 mg/dL — AB (ref 0–99)
TRIGLYCERIDES: 147 mg/dL (ref <150)
VLDL: 29 mg/dL (ref 0–40)

## 2016-05-16 MED ORDER — LISINOPRIL-HYDROCHLOROTHIAZIDE 20-12.5 MG PO TABS: 1.0000 | ORAL_TABLET | Freq: Every day | ORAL | 4 refills | Status: DC

## 2016-05-16 NOTE — Progress Notes (Signed)
BP 100/70 (BP Location: Left Arm, Patient Position: Sitting, Cuff Size: Normal)   Pulse 68   Temp 97.9 F (36.6 C)   Ht 5' 2.5" (1.588 m)   Wt 192 lb 8 oz (87.3 kg)   SpO2 97%   BMI 34.65 kg/m    Subjective:    Patient ID: Alyssa [name], female    DOB: 06/26/1955, 61 y.o.   MRN: [id number]  HPI: Alyssa Flemingsina M Pocius is a 61 y.o. female presenting on 05/16/2016 for Follow-up   HPI  Pt is still working at [employer].  She is still liking it.  She is still smoking.   She has no complaints except occassional aches and pains  Relevant past medical, surgical, family and social history reviewed and updated as indicated. Interim medical history since our last visit reviewed. Allergies and medications reviewed and updated.   Current Outpatient Prescriptions:  .  aspirin 81 MG tablet, Take 81 mg by mouth daily., Disp: , Rfl:  .  levothyroxine (SYNTHROID, LEVOTHROID) 125 MCG tablet, TAKE 1 TABLET BY MOUTH ONCE DAILY, Disp: 14 tablet, Rfl: 0 .  lisinopril-hydrochlorothiazide (ZESTORETIC) 20-12.5 MG tablet, Take 1 tablet by mouth daily., Disp: 30 tablet, Rfl: 3 .  lovastatin (MEVACOR) 20 MG tablet, Take 1 tablet (20 mg total) by mouth at bedtime., Disp: 30 tablet, Rfl: 4 .  omeprazole (PRILOSEC) 40 MG capsule, Take 1 capsule (40 mg total) by mouth daily. (Patient taking differently: Take 20 mg by mouth daily. ), Disp: 90 capsule, Rfl: 2 .  ranitidine (ZANTAC) 300 MG tablet, Take 1 tablet (300 mg total) by mouth at bedtime., Disp: 30 tablet, Rfl: 4  Review of Systems  Constitutional: Positive for fatigue. Negative for appetite change, chills, diaphoresis, fever and unexpected weight change.  HENT: Negative for congestion, dental problem, drooling, ear pain, facial swelling, hearing loss, mouth sores, sneezing, sore throat, trouble swallowing and voice change.   Eyes: Negative for pain, discharge, redness, itching and visual disturbance.  Respiratory: Negative for cough, choking, shortness of  breath and wheezing.   Cardiovascular: Negative for chest pain, palpitations and leg swelling.  Gastrointestinal: Negative for abdominal pain, blood in stool, constipation, diarrhea and vomiting.  Endocrine: Negative for cold intolerance, heat intolerance and polydipsia.  Genitourinary: Negative for decreased urine volume, dysuria and hematuria.  Musculoskeletal: Positive for arthralgias and gait problem. Negative for back pain.  Skin: Negative for rash.  Allergic/Immunologic: Negative for environmental allergies.  Neurological: Negative for seizures, syncope, light-headedness and headaches.  Hematological: Negative for adenopathy.  Psychiatric/Behavioral: Negative for agitation, dysphoric mood and suicidal ideas. The patient is not nervous/anxious.     Per HPI unless specifically indicated above     Objective:    BP 100/70 (BP Location: Left Arm, Patient Position: Sitting, Cuff Size: Normal)   Pulse 68   Temp 97.9 F (36.6 C)   Ht 5' 2.5" (1.588 m)   Wt 192 lb 8 oz (87.3 kg)   SpO2 97%   BMI 34.65 kg/m   Wt Readings from Last 3 Encounters:  05/16/16 192 lb 8 oz (87.3 kg)  01/24/16 192 lb 12 oz (87.4 kg)  01/23/16 192 lb (87.1 kg)    Physical Exam  Constitutional: She is oriented to person, place, and time. She appears well-developed and well-nourished.  HENT:  Head: Normocephalic and atraumatic.  Neck: Neck supple.  Cardiovascular: Normal rate and regular rhythm.   Pulmonary/Chest: Effort normal and breath sounds normal.  Abdominal: Soft. Bowel sounds are normal. She exhibits no  mass. There is no hepatosplenomegaly. There is no tenderness.  Musculoskeletal: She exhibits no edema.  Lymphadenopathy:    She has no cervical adenopathy.  Neurological: She is alert and oriented to person, place, and time.  Skin: Skin is warm and dry.  Psychiatric: She has a normal mood and affect. Her behavior is normal.  Vitals reviewed.       Assessment & Plan:    Encounter  Diagnoses  Name Primary?  . Essential hypertension, benign Yes  . Hyperlipidemia, unspecified hyperlipidemia type   . Hypothyroidism, unspecified type   . Gastroesophageal reflux disease, esophagitis presence not specified   . Cigarette nicotine dependence, uncomplicated   . Obesity, unspecified classification, unspecified obesity type, unspecified whether serious comorbidity present   . Special screening for malignant neoplasms, colon   . Prediabetes      -Discussed with pt that she doesn't need omeprazole and ranitidine. If ranitidine works, don't take the omeprazole -pt to Get fasting labs -Gave iFOBT for colon cancer screening- pt never returned her other one last year but she says she will this time -counseled smoking cessation -no medication changes -follow up 4 months. RTO sooner prn

## 2016-05-18 ENCOUNTER — Other Ambulatory Visit: Payer: Self-pay | Admitting: Physician Assistant

## 2016-05-18 MED ORDER — ATORVASTATIN CALCIUM 20 MG PO TABS: 20.0000 mg | ORAL_TABLET | Freq: Every day | ORAL | 3 refills | Status: DC

## 2016-05-25 ENCOUNTER — Other Ambulatory Visit: Payer: Self-pay | Admitting: Physician Assistant

## 2016-05-25 MED ORDER — LEVOTHYROXINE SODIUM 125 MCG PO TABS: 125.0000 ug | ORAL_TABLET | Freq: Every day | ORAL | 4 refills | Status: DC

## 2016-07-03 ENCOUNTER — Other Ambulatory Visit: Payer: Self-pay | Admitting: Physician Assistant

## 2016-07-03 DIAGNOSIS — R7303 Prediabetes: Secondary | ICD-10-CM

## 2016-07-03 DIAGNOSIS — I10 Essential (primary) hypertension: Secondary | ICD-10-CM

## 2016-07-03 DIAGNOSIS — E785 Hyperlipidemia, unspecified: Secondary | ICD-10-CM

## 2016-09-08 ENCOUNTER — Other Ambulatory Visit (HOSPITAL_COMMUNITY)
Admission: RE | Admit: 2016-09-08 | Discharge: 2016-09-08 | Disposition: A | Discharge status: Home / Self Care | Payer: Self-pay | Source: Ambulatory Visit | Attending: Physician Assistant | Admitting: Physician Assistant

## 2016-09-08 DIAGNOSIS — R7303 Prediabetes: Secondary | ICD-10-CM

## 2016-09-08 DIAGNOSIS — E785 Hyperlipidemia, unspecified: Secondary | ICD-10-CM

## 2016-09-08 DIAGNOSIS — I10 Essential (primary) hypertension: Secondary | ICD-10-CM

## 2016-09-08 LAB — COMPREHENSIVE METABOLIC PANEL
ALK PHOS: 84 U/L (ref 38–126)
ALT: 17 U/L (ref 14–54)
ANION GAP: 9 (ref 5–15)
AST: 19 U/L (ref 15–41)
Albumin: 4.2 g/dL (ref 3.5–5.0)
BUN: 13 mg/dL (ref 6–20)
CALCIUM: 9.4 mg/dL (ref 8.9–10.3)
CO2: 22 mmol/L (ref 22–32)
Chloride: 107 mmol/L (ref 101–111)
Creatinine, Ser: 0.73 mg/dL (ref 0.44–1.00)
GFR calc non Af Amer: >60 mL/min (ref >60)
Glucose, Bld: 86 mg/dL (ref 65–99)
Potassium: 3.8 mmol/L (ref 3.5–5.1)
Sodium: 138 mmol/L (ref 135–145)
TOTAL PROTEIN: 7.8 g/dL (ref 6.5–8.1)
Total Bilirubin: 0.5 mg/dL (ref 0.3–1.2)

## 2016-09-08 LAB — LIPID PANEL
CHOLESTEROL: 234 mg/dL — AB (ref 0–200)
HDL: 38 mg/dL — AB (ref >40)
LDL CALC: 168 mg/dL — AB (ref 0–99)
TRIGLYCERIDES: 142 mg/dL (ref <150)
Total CHOL/HDL Ratio: 6.2 RATIO
VLDL: 28 mg/dL (ref 0–40)

## 2016-09-09 LAB — HEMOGLOBIN A1C
HEMOGLOBIN A1C: 5.6 % (ref 4.8–5.6)
Mean Plasma Glucose: 114.02 mg/dL

## 2016-09-11 ENCOUNTER — Ambulatory Visit: Payer: Self-pay | Admitting: Physician Assistant

## 2016-09-12 ENCOUNTER — Encounter: Payer: Self-pay | Admitting: Physician Assistant

## 2016-09-12 ENCOUNTER — Ambulatory Visit: Payer: Self-pay | Admitting: Physician Assistant

## 2016-09-12 VITALS — BP 124/76 | HR 55 | Temp 97.7°F | Ht 62.5 in | Wt 198.0 lb

## 2016-09-12 DIAGNOSIS — I10 Essential (primary) hypertension: Secondary | ICD-10-CM

## 2016-09-12 DIAGNOSIS — K219 Gastro-esophageal reflux disease without esophagitis: Secondary | ICD-10-CM

## 2016-09-12 DIAGNOSIS — E785 Hyperlipidemia, unspecified: Secondary | ICD-10-CM

## 2016-09-12 DIAGNOSIS — E039 Hypothyroidism, unspecified: Secondary | ICD-10-CM

## 2016-09-12 DIAGNOSIS — E669 Obesity, unspecified: Secondary | ICD-10-CM

## 2016-09-12 DIAGNOSIS — F17210 Nicotine dependence, cigarettes, uncomplicated: Secondary | ICD-10-CM

## 2016-09-12 MED ORDER — OMEPRAZOLE 40 MG PO CPDR: 40.0000 mg | DELAYED_RELEASE_CAPSULE | Freq: Every day | ORAL | 2 refills | Status: DC

## 2016-09-12 MED ORDER — ATORVASTATIN CALCIUM 40 MG PO TABS: 40.0000 mg | ORAL_TABLET | Freq: Every day | ORAL | 3 refills | Status: DC

## 2016-09-12 NOTE — Progress Notes (Signed)
BP 124/76 (BP Location: Left Arm, Patient Position: Sitting, Cuff Size: Large)   Pulse (!) 55   Temp 97.7 F (36.5 C) (Other (Comment))   Ht 5' 2.5" (1.588 m)   Wt 198 lb (89.8 kg)   SpO2 98%   BMI 35.64 kg/m    Subjective:    Patient ID: Alyssa [name], female    DOB: 04/05/1955, 61 y.o.   MRN: [id number]  HPI: Alyssa [name] is a 61 y.o. female presenting on 09/12/2016 for Hyperlipidemia; Hypothyroidism; Hypertension; and Gastroesophageal Reflux   HPI   She is still working at [employer].    She is still smoking  She is doing well and has no complaints  Relevant past medical, surgical, family and social history reviewed and updated as indicated. Interim medical history since our last visit reviewed. Allergies and medications reviewed and updated.   Current Outpatient Prescriptions:  .  aspirin 81 MG tablet, Take 81 mg by mouth daily., Disp: , Rfl:  .  atorvastatin (LIPITOR) 20 MG tablet, Take 1 tablet (20 mg total) by mouth daily., Disp: 90 tablet, Rfl: 3 .  levothyroxine (SYNTHROID, LEVOTHROID) 125 MCG tablet, Take 1 tablet (125 mcg total) by mouth daily., Disp: 30 tablet, Rfl: 4 .  lisinopril-hydrochlorothiazide (ZESTORETIC) 20-12.5 MG tablet, Take 1 tablet by mouth daily., Disp: 30 tablet, Rfl: 4 .  omeprazole (PRILOSEC) 40 MG capsule, Take 1 capsule (40 mg total) by mouth daily. (Patient taking differently: Take 20 mg by mouth daily. ), Disp: 90 capsule, Rfl: 2   Review of Systems  Constitutional: Positive for fatigue. Negative for appetite change, chills, diaphoresis, fever and unexpected weight change.  HENT: Negative for congestion, dental problem, drooling, ear pain, facial swelling, hearing loss, mouth sores, sneezing, sore throat, trouble swallowing and voice change.   Eyes: Positive for visual disturbance. Negative for pain, discharge, redness and itching.  Respiratory: Negative for cough, choking, shortness of breath and wheezing.   Cardiovascular: Negative for  chest pain, palpitations and leg swelling.  Gastrointestinal: Negative for abdominal pain, blood in stool, constipation, diarrhea and vomiting.  Endocrine: Negative for cold intolerance, heat intolerance and polydipsia.  Genitourinary: Negative for decreased urine volume, dysuria and hematuria.  Musculoskeletal: Negative for arthralgias, back pain and gait problem.  Skin: Negative for rash.  Allergic/Immunologic: Negative for environmental allergies.  Neurological: Negative for seizures, syncope, light-headedness and headaches.  Hematological: Negative for adenopathy.  Psychiatric/Behavioral: Negative for agitation, dysphoric mood and suicidal ideas. The patient is not nervous/anxious.     Per HPI unless specifically indicated above     Objective:    BP 124/76 (BP Location: Left Arm, Patient Position: Sitting, Cuff Size: Large)   Pulse (!) 55   Temp 97.7 F (36.5 C) (Other (Comment))   Ht 5' 2.5" (1.588 m)   Wt 198 lb (89.8 kg)   SpO2 98%   BMI 35.64 kg/m   Wt Readings from Last 3 Encounters:  09/12/16 198 lb (89.8 kg)  05/16/16 192 lb 8 oz (87.3 kg)  01/24/16 192 lb 12 oz (87.4 kg)    Physical Exam  Constitutional: She is oriented to person, place, and time. She appears well-developed and well-nourished.  HENT:  Head: Normocephalic and atraumatic.  Neck: Neck supple.  Cardiovascular: Normal rate and regular rhythm.   Pulmonary/Chest: Effort normal and breath sounds normal.  Abdominal: Soft. Bowel sounds are normal. She exhibits no mass. There is no hepatosplenomegaly. There is no tenderness.  Musculoskeletal: She exhibits no edema.  Lymphadenopathy:  She has no cervical adenopathy.  Neurological: She is alert and oriented to person, place, and time.  Skin: Skin is warm and dry.  Psychiatric: She has a normal mood and affect. Her behavior is normal.  Vitals reviewed.   Results for orders placed or performed during the hospital encounter of 09/08/16  Comprehensive  metabolic panel  Result Value Ref Range   Sodium 138 135 - 145 mmol/L   Potassium 3.8 3.5 - 5.1 mmol/L   Chloride 107 101 - 111 mmol/L   CO2 22 22 - 32 mmol/L   Glucose, Bld 86 65 - 99 mg/dL   BUN 13 6 - 20 mg/dL   Creatinine, Ser 8.29 0.44 - 1.00 mg/dL   Calcium 9.4 8.9 - 56.2 mg/dL   Total Protein 7.8 6.5 - 8.1 g/dL   Albumin 4.2 3.5 - 5.0 g/dL   AST 19 15 - 41 U/L   ALT 17 14 - 54 U/L   Alkaline Phosphatase 84 38 - 126 U/L   Total Bilirubin 0.5 0.3 - 1.2 mg/dL   GFR calc non Af Amer >60 >60 mL/min   GFR calc Af Amer >60 >60 mL/min   Anion gap 9 5 - 15  Lipid panel  Result Value Ref Range   Cholesterol 234 (H) 0 - 200 mg/dL   Triglycerides 130 <865 mg/dL   HDL 38 (L) >78 mg/dL   Total CHOL/HDL Ratio 6.2 RATIO   VLDL 28 0 - 40 mg/dL   LDL Cholesterol 469 (H) 0 - 99 mg/dL  Hemoglobin G2X  Result Value Ref Range   Hgb A1c MFr Bld 5.6 4.8 - 5.6 %   Mean Plasma Glucose 114.02 mg/dL      Assessment & Plan:   Encounter Diagnoses  Name Primary?  . Essential hypertension, benign Yes  . Hyperlipidemia, unspecified hyperlipidemia type   . Hypothyroidism, unspecified type   . Obesity, unspecified classification, unspecified obesity type, unspecified whether serious comorbidity present   . Gastroesophageal reflux disease, esophagitis presence not specified   . Cigarette nicotine dependence, uncomplicated     -reviewed labs with pt -pt no longer diabetic or prediabetic. -counseled pt to maintain weight loss and avoid overloading the carbohydrates to prevent return of diabetes -will increase atorvastatin.  Pt counseled to  Follow lowfat diet and recommended regular exercise. -counseled smoking cessation -pt to follow up 3 months.  RTO sooner prn

## 2016-09-13 ENCOUNTER — Ambulatory Visit: Payer: Self-pay | Admitting: Physician Assistant

## 2016-10-17 ENCOUNTER — Ambulatory Visit: Payer: Self-pay | Admitting: Physician Assistant

## 2016-11-19 ENCOUNTER — Emergency Department (HOSPITAL_COMMUNITY)
Admission: EM | Admit: 2016-11-19 | Discharge: 2016-11-19 | Disposition: A | Discharge status: Home / Self Care | Payer: Self-pay | Attending: Emergency Medicine | Admitting: Emergency Medicine

## 2016-11-19 ENCOUNTER — Encounter (HOSPITAL_COMMUNITY): Payer: Self-pay

## 2016-11-19 DIAGNOSIS — E039 Hypothyroidism, unspecified: Secondary | ICD-10-CM | POA: Insufficient documentation

## 2016-11-19 DIAGNOSIS — I10 Essential (primary) hypertension: Secondary | ICD-10-CM | POA: Insufficient documentation

## 2016-11-19 DIAGNOSIS — E119 Type 2 diabetes mellitus without complications: Secondary | ICD-10-CM | POA: Insufficient documentation

## 2016-11-19 DIAGNOSIS — F17210 Nicotine dependence, cigarettes, uncomplicated: Secondary | ICD-10-CM | POA: Insufficient documentation

## 2016-11-19 DIAGNOSIS — K047 Periapical abscess without sinus: Secondary | ICD-10-CM | POA: Insufficient documentation

## 2016-11-19 DIAGNOSIS — Z7982 Long term (current) use of aspirin: Secondary | ICD-10-CM | POA: Insufficient documentation

## 2016-11-19 DIAGNOSIS — K0889 Other specified disorders of teeth and supporting structures: Secondary | ICD-10-CM

## 2016-11-19 MED ORDER — ACETAMINOPHEN 500 MG PO TABS
1000.0000 mg | ORAL_TABLET | Freq: Once | ORAL | Status: AC
  Administered 2016-11-19: 1000 mg via ORAL
  Filled 2016-11-19: qty 2

## 2016-11-19 MED ORDER — HYDROCODONE-ACETAMINOPHEN 5-325 MG PO TABS: 1.0000 | ORAL_TABLET | Freq: Four times a day (QID) | ORAL | 0 refills | Status: DC | PRN

## 2016-11-19 MED ORDER — PENICILLIN V POTASSIUM 500 MG PO TABS: 500.0000 mg | ORAL_TABLET | Freq: Four times a day (QID) | ORAL | 0 refills | Status: AC

## 2016-11-19 NOTE — Discharge Instructions (Signed)
You can take Tylenol or Ibuprofen as directed for pain. You can alternate Tylenol and Ibuprofen every 4 hours. If you take Tylenol at 1pm, then you can take Ibuprofen at 5pm. Then you can take Tylenol again at 9pm.   You can take 1 tablet of the pain medication for severe or breakthrough pain. Do not take it at the same time.    Follow-up with the referred dentist.   The exam and treatment you received today has been provided on an emergency basis only. This is not a substitute for complete medical or dental care. If your problem worsens or new symptoms (problems) appear, and you are unable to arrange prompt follow-up care with your dentist, call or return to this location. If you do not have a dentist, please follow-up with one on the list provided  CALL YOUR DENTIST OR RETURN IMMEDIATELY IF you develop a fever, rash, difficulty breathing or swallowing, neck or facial swelling, or other potentially serious concerns.   Please follow-up with one of the dental clinics provided to you below or in your paperwork. Call and tell them you were seen in the Emergency Dept and arrange for an appointment. You may have to call multiple places in order to find a place to be seen.  Dental Assistance If the dentist on-call cannot see you, please use the resources below:   Patients with Medicaid: [facility] [phone], [phone] [address], [phone]  If unable to pay, or uninsured, contact [facility] [phone]) or [facility] [phone] in [place], [phone] in [facility]) to become qualified for the adult dental clinic  Other Low-Cost Community Dental Services: [facility]- [address], [place], [place], [address]    [phone], [phone]. [phone]    2nd and 4th Thursday of the month at 6:30am    10 clients each day by appointment, can sometimes see walk-in     patients if someone does not show for an appointment [facility]- [address], [place], [place], [address]    [phone] [facility]- [address], [place], [place], [address]    [phone]  [facility]- [phone] [facility]- [phone] [facility]- [phone]

## 2016-11-19 NOTE — ED Provider Notes (Signed)
[facility] EMERGENCY DEPARTMENT Provider Note   CSN: [id number] Arrival date & time: 11/19/16  [id number]     History   Chief Complaint Chief Complaint  Patient presents with  . Dental Pain    HPI Alyssa [name] is a 61 y.o. female who presents with left-sided dental pain that began yesterday.  Patient reports that his symptoms have progressively worsened since yesterday.  She describes the pain as a "throbbing" sensation.  She has been taking aspirin and Goody's powder with minimal improvement.  Patient has had no difficulty tolerating p.o. or her secretions.  Patient states that she may be noticed some very mild swelling noted but no overlying warmth.  No difficulty opening closing her mouth.  Patient is seen by the medical clinics but was unable to get an appointment this weekend.  Patient denies any fevers, vomiting, chest pain, difficulty breathing, difficult swallowing.  The history is provided by the patient.    Past Medical History:  Diagnosis Date  . Diabetes mellitus without complication (HCC)   . GERD (gastroesophageal reflux disease)   . Hyperlipidemia   . Hypertension   . Thyroid disease     Patient Active Problem List   Diagnosis Date Noted  . Esophageal reflux 05/15/2015  . Thyroid activity decreased 01/12/2015  . Hyperlipidemia 11/29/2014  . Essential hypertension, benign 11/29/2014  . Cigarette nicotine dependence, uncomplicated 11/29/2014  . Morbid obesity (HCC) 11/29/2014    Past Surgical History:  Procedure Laterality Date  . CARPAL TUNNEL RELEASE    . TUBAL LIGATION      OB History    Gravida Para Term Preterm AB Living   2 2 2     2    SAB TAB Ectopic Multiple Live Births                   Home Medications    Prior to Admission medications   Medication Sig Start Date End Date Taking? Authorizing Provider  aspirin 81 MG tablet Take 81 mg by mouth daily.    [provider]  atorvastatin (LIPITOR) 40 MG tablet Take 1 tablet (40 mg  total) by mouth daily. 09/12/16   Jacquelin HawkingMcElroy, Shannon, PA-C  HYDROcodone-acetaminophen (NORCO/VICODIN) 5-325 MG tablet Take 1-2 tablets every 6 (six) hours as needed by mouth. 11/19/16   Maxwell CaulLayden, Roneka Gilpin A, PA-C  levothyroxine (SYNTHROID, LEVOTHROID) 125 MCG tablet Take 1 tablet (125 mcg total) by mouth daily. 05/25/16   Jacquelin HawkingMcElroy, Shannon, PA-C  lisinopril-hydrochlorothiazide (ZESTORETIC) 20-12.5 MG tablet Take 1 tablet by mouth daily. 05/16/16   Jacquelin HawkingMcElroy, Shannon, PA-C  omeprazole (PRILOSEC) 40 MG capsule Take 1 capsule (40 mg total) by mouth daily. 09/12/16   Jacquelin HawkingMcElroy, Shannon, PA-C  penicillin v potassium (VEETID) 500 MG tablet Take 1 tablet (500 mg total) 4 (four) times daily for 7 days by mouth. 11/19/16 11/26/16  Maxwell CaulLayden, Taesha Goodell A, PA-C    Family History Family History  Problem Relation Age of Onset  . Alzheimer's disease Mother   . Alcohol abuse Father     Social History Social History   Tobacco Use  . Smoking status: Current Every Day Smoker    Packs/day: 0.50    Years: 42.00    Pack years: 21.00    Types: Cigarettes  . Smokeless tobacco: Never Used  Substance Use Topics  . Alcohol use: No  . Drug use: No     Allergies   Sulfa antibiotics   Review of Systems Review of Systems  Constitutional: Negative for fever.  HENT:  Positive for dental problem and facial swelling. Negative for drooling and trouble swallowing.   Respiratory: Negative for shortness of breath.   Cardiovascular: Negative for chest pain.     Physical Exam Updated Vital Signs BP 139/79   Pulse 65   Temp 97.8 F (36.6 C)   Resp 18   Ht 5\' 4"  (1.626 m)   Wt 86.2 kg (190 lb)   SpO2 97%   BMI 32.61 kg/m   Physical Exam  Constitutional: She appears well-developed and well-nourished.  Sitting comfortably on examination table  HENT:  Head: Normocephalic and atraumatic.    Mouth/Throat: Uvula is midline, oropharynx is clear and moist and mucous membranes are normal. No trismus in the jaw. Abnormal  dentition. Dental caries present.  Diffusely scattered dental caries throughout remaining teeth.  Multiple missing teeth noted.  Tooth number 10 is partially cracked with evidence of dental caries.  Tooth #18 with obvious dental caries.  No surrounding gingival swelling or erythema.  No obvious fluctuance.  No trismus.  Uvula is midline.  No evidence of mass.  No neck swelling.  Eyes: Conjunctivae and EOM are normal. Right eye exhibits no discharge. Left eye exhibits no discharge. No scleral icterus.  Pulmonary/Chest: Effort normal.  No evidence of respiratory distress. Able to speak in full sentences without difficulty.  Neurological: She is alert.  Skin: Skin is warm and dry.  Psychiatric: She has a normal mood and affect. Her speech is normal and behavior is normal.  Nursing note and vitals reviewed.    ED Treatments / Results  Labs (all labs ordered are listed, but only abnormal results are displayed) Labs Reviewed - No data to display  EKG  EKG Interpretation None       Radiology No results found.  Procedures Procedures (including critical care time)  Medications Ordered in ED Medications  acetaminophen (TYLENOL) tablet 1,000 mg (1,000 mg Oral Given 11/19/16 6962)     Initial Impression / Assessment and Plan / ED Course  I have reviewed the triage vital signs and the nursing notes.  Pertinent labs & imaging results that were available during my care of the patient were reviewed by me and considered in my medical decision making (see chart for details).     61 yo F presents with 1 day of dental pain. No fevers, vomiting, difficulty swallowing. Patient is afebrile, non-toxic appearing, sitting comfortably on examination table. Vital signs reviewed and stable. Patient is slightly hypertensive, likely secondary to pain.  Will reassess.  Physical exam is suspicious for potential small periapical dental abscess that is not amenable to I&D. Exam not concerning for Ludwig's  angina or pharyngeal abscess.  Offered patient a dental block in the emergency department but she declines at this time.  Patient reviewed on [place] prescription monitoring program. She has had no recent prescriptions. Patient drove to the ED and does not have a ride home. I explained to her that I cannot give her any sedating medications here in the ED. Will give her one dose of pain medication to go home with. Will treat with abx. Patient has known allergy to sulfa but is able to tolerate penicillins without difficulty. Repeat blood pressure improved.  Patient instructed to follow-up with dentist referral provided. Stable for discharge at this time. Strict return precautions discussed. Patient expresses understanding and agreement to plan.   Final Clinical Impressions(s) / ED Diagnoses   Final diagnoses:  Pain, dental  Dental abscess    ED Discharge Orders  Ordered    penicillin v potassium (VEETID) 500 MG tablet  4 times daily     11/19/16 0828    HYDROcodone-acetaminophen (NORCO/VICODIN) 5-325 MG tablet  Every 6 hours PRN     11/19/16 0828       Maxwell CaulLayden, Bauer Ausborn A, PA-C 11/19/16 [id number]    Jacalyn LefevreHaviland, Julie, MD 11/19/16 [phone]

## 2016-11-19 NOTE — ED Triage Notes (Signed)
Pt c/o toothache since yesterday.  

## 2016-11-20 ENCOUNTER — Other Ambulatory Visit: Payer: Self-pay | Admitting: Physician Assistant

## 2016-12-07 ENCOUNTER — Encounter (HOSPITAL_COMMUNITY): Payer: Self-pay | Admitting: *Deleted

## 2016-12-07 ENCOUNTER — Emergency Department (HOSPITAL_COMMUNITY)
Admission: EM | Admit: 2016-12-07 | Discharge: 2016-12-07 | Disposition: A | Discharge status: Home / Self Care | Payer: Self-pay | Attending: Emergency Medicine | Admitting: Emergency Medicine

## 2016-12-07 ENCOUNTER — Other Ambulatory Visit: Payer: Self-pay

## 2016-12-07 DIAGNOSIS — R4789 Other speech disturbances: Secondary | ICD-10-CM | POA: Insufficient documentation

## 2016-12-07 DIAGNOSIS — R1310 Dysphagia, unspecified: Secondary | ICD-10-CM | POA: Insufficient documentation

## 2016-12-07 DIAGNOSIS — I10 Essential (primary) hypertension: Secondary | ICD-10-CM | POA: Insufficient documentation

## 2016-12-07 DIAGNOSIS — R42 Dizziness and giddiness: Secondary | ICD-10-CM | POA: Insufficient documentation

## 2016-12-07 DIAGNOSIS — Z7982 Long term (current) use of aspirin: Secondary | ICD-10-CM | POA: Insufficient documentation

## 2016-12-07 DIAGNOSIS — E119 Type 2 diabetes mellitus without complications: Secondary | ICD-10-CM | POA: Insufficient documentation

## 2016-12-07 DIAGNOSIS — Z79899 Other long term (current) drug therapy: Secondary | ICD-10-CM | POA: Insufficient documentation

## 2016-12-07 DIAGNOSIS — F17210 Nicotine dependence, cigarettes, uncomplicated: Secondary | ICD-10-CM | POA: Insufficient documentation

## 2016-12-07 LAB — BASIC METABOLIC PANEL
ANION GAP: 7 (ref 5–15)
BUN: 14 mg/dL (ref 6–20)
CALCIUM: 9 mg/dL (ref 8.9–10.3)
CO2: 23 mmol/L (ref 22–32)
Chloride: 104 mmol/L (ref 101–111)
Creatinine, Ser: 0.66 mg/dL (ref 0.44–1.00)
GFR calc Af Amer: >60 mL/min (ref >60)
GLUCOSE: 125 mg/dL — AB (ref 65–99)
POTASSIUM: 3.6 mmol/L (ref 3.5–5.1)
SODIUM: 134 mmol/L — AB (ref 135–145)

## 2016-12-07 LAB — CBC WITH DIFFERENTIAL/PLATELET
BASOS ABS: 0.1 10*3/uL (ref 0.0–0.1)
BASOS PCT: 1 %
Eosinophils Absolute: 0.3 10*3/uL (ref 0.0–0.7)
Eosinophils Relative: 4 %
HCT: 38.6 % (ref 36.0–46.0)
Hemoglobin: 12.6 g/dL (ref 12.0–15.0)
LYMPHS PCT: 44 %
Lymphs Abs: 3.2 10*3/uL (ref 0.7–4.0)
MCH: 31 pg (ref 26.0–34.0)
MCHC: 32.6 g/dL (ref 30.0–36.0)
MCV: 94.8 fL (ref 78.0–100.0)
Monocytes Absolute: 0.6 10*3/uL (ref 0.1–1.0)
Monocytes Relative: 9 %
Neutro Abs: 3 10*3/uL (ref 1.7–7.7)
Neutrophils Relative %: 42 %
PLATELETS: 364 10*3/uL (ref 150–400)
RBC: 4.07 MIL/uL (ref 3.87–5.11)
RDW: 12.9 % (ref 11.5–15.5)
WBC: 7.2 10*3/uL (ref 4.0–10.5)

## 2016-12-07 LAB — TSH: TSH: 2.304 u[IU]/mL (ref 0.350–4.500)

## 2016-12-07 MED ORDER — HYDROXYZINE HCL 25 MG PO TABS
25.0000 mg | ORAL_TABLET | Freq: Once | ORAL | Status: AC
  Administered 2016-12-07: 25 mg via ORAL
  Filled 2016-12-07: qty 1

## 2016-12-07 NOTE — ED Provider Notes (Signed)
[facility]ENCY [facility]PARTMENT Provider Note   CSN: [id number] Arrival date & time: 12/07/16  0255  Time seen 03:08 AM   History   Chief Complaint Chief Complaint  Patient presents with  . Shortness of Breath    HPI Alyssa [name] is a 61 y.o. female.  HPI please note history is difficult to obtain because patient keeps jumping from topic to topic and talking about things in the middle and I do not understand what she is talking about.  But basically she states she woke up at 2 AM feeling nervous and was shaking.  She states any time she gets excited, or anxious, or worried about her health she feels like her throat gets stuffy and she cannot breathe.  She states that is why she takes omeprazole.  She states after she woke up feeling shaky and nervous she then felt like her throat was stuffy.  She also states she felt "swimmy headed".  She states maybe there was some mild spinning but she denies a feeling of passing out.  She states she had difficulty walking and clarifies that that means she was walking slowly.  She states her eyes were "weak" which meant blurry.  She states that trouble breathing lasted 20-30 minutes and resolved prior to coming to the ED.  She denies headache, chest pain, coughing, rhinorrhea, heart racing, and states she actually felt like she was "in slow motion".  Her son-in-law states during this episode she was having normal color, she was not diaphoretic, her voice was normal.  She felt like she was having some mild wheezing which she has had in the past.  She currently is not on inhalers or nebulizers but has been in the past.  She does agree that she is a anxious person.  She states her daughter was started on thyroid medicine on December 5 and they were comparing their thyroid pills.  She thinks she may have taken an extra one this evening.  She denies anything else different happening today.  She denies any other upsetting events.  She states now she just feels  "jittery".  PCP Jacquelin HawkingMcElroy, Shannon, PA-C   Past Medical History:  Diagnosis Date  . Diabetes mellitus without complication (HCC)   . GERD (gastroesophageal reflux disease)   . Hyperlipidemia   . Hypertension   . Thyroid disease     Patient Active Problem List   Diagnosis Date Noted  . Esophageal reflux 05/15/2015  . Thyroid activity decreased 01/12/2015  . Hyperlipidemia 11/29/2014  . Essential hypertension, benign 11/29/2014  . Cigarette nicotine dependence, uncomplicated 11/29/2014  . Morbid obesity (HCC) 11/29/2014    Past Surgical History:  Procedure Laterality Date  . CARPAL TUNNEL RELEASE    . TUBAL LIGATION      OB History    Gravida Para Term Preterm AB Living   2 2 2     2    SAB TAB Ectopic Multiple Live Births                   Home Medications    Prior to Admission medications   Medication Sig Start Date End Date Taking? Authorizing Provider  atorvastatin (LIPITOR) 40 MG tablet Take 1 tablet (40 mg total) by mouth daily. 09/12/16  Yes Jacquelin HawkingMcElroy, Shannon, PA-C  levothyroxine (SYNTHROID, LEVOTHROID) 125 MCG tablet TAKE 1 TABLET BY MOUTH ONCE DAILY 11/20/16  Yes Jacquelin HawkingMcElroy, Shannon, PA-C  lisinopril-hydrochlorothiazide (PRINZIDE,ZESTORETIC) 20-12.5 MG tablet TAKE 1 TABLET BY MOUTH ONCE DAILY 11/20/16  Yes  Jacquelin HawkingMcElroy, Shannon, PA-C  omeprazole (PRILOSEC) 40 MG capsule Take 1 capsule (40 mg total) by mouth daily. 09/12/16  Yes Jacquelin HawkingMcElroy, Shannon, PA-C  aspirin 81 MG tablet Take 81 mg by mouth daily.    [provider]  HYDROcodone-acetaminophen (NORCO/VICODIN) 5-325 MG tablet Take 1-2 tablets every 6 (six) hours as needed by mouth. 11/19/16   Maxwell CaulLayden, Lindsey A, PA-C    Family History Family History  Problem Relation Age of Onset  . Alzheimer's disease Mother   . Alcohol abuse Father     Social History Social History   Tobacco Use  . Smoking status: Current Every Day Smoker    Packs/day: 0.50    Years: 42.00    Pack years: 21.00    Types: Cigarettes    . Smokeless tobacco: Never Used  Substance Use Topics  . Alcohol use: No  . Drug use: No  employed at [employer]    Allergies   Sulfa antibiotics   Review of Systems Review of Systems  All other systems reviewed and are negative.    Physical Exam Updated Vital Signs BP (!) 138/99 (BP Location: Left Arm)   Pulse 67   Resp 20   Ht 5\' 4"  (1.626 m)   Wt 86.2 kg (190 lb)   SpO2 99%   BMI 32.61 kg/m   Vital signs normal except diastolic hypertension   Physical Exam  Constitutional: She is oriented to person, place, and time. She appears well-developed and well-nourished.  Non-toxic appearance. She does not appear ill. No distress.  HENT:  Head: Normocephalic and atraumatic.  Right Ear: External ear normal.  Left Ear: External ear normal.  Nose: Nose normal. No mucosal edema or rhinorrhea.  Mouth/Throat: Oropharynx is clear and moist and mucous membranes are normal. No dental abscesses or uvula swelling.  Eyes: Conjunctivae and EOM are normal. Pupils are equal, round, and reactive to light.  Neck: Normal range of motion and full passive range of motion without pain. Neck supple.  Cardiovascular: Normal rate, regular rhythm and normal heart sounds. Exam reveals no gallop and no friction rub.  No murmur heard. Pulmonary/Chest: Effort normal and breath sounds normal. No respiratory distress. She has no wheezes. She has no rhonchi. She has no rales. She exhibits no tenderness and no crepitus.  Abdominal: Soft. Normal appearance and bowel sounds are normal. She exhibits no distension. There is no tenderness. There is no rebound and no guarding.  Musculoskeletal: Normal range of motion. She exhibits no edema or tenderness.  Moves all extremities well.   Neurological: She is alert and oriented to person, place, and time. She has normal strength. No cranial nerve deficit.  Skin: Skin is warm, dry and intact. No rash noted. No erythema. No pallor.  Psychiatric: Her mood appears  anxious. Her speech is rapid and/or pressured.  Nursing note and vitals reviewed.    ED Treatments / Results  Labs (all labs ordered are listed, but only abnormal results are displayed) Results for orders placed or performed during the hospital encounter of 12/07/16  Basic metabolic panel  Result Value Ref Range   Sodium 134 (L) 135 - 145 mmol/L   Potassium 3.6 3.5 - 5.1 mmol/L   Chloride 104 101 - 111 mmol/L   CO2 23 22 - 32 mmol/L   Glucose, Bld 125 (H) 65 - 99 mg/dL   BUN 14 6 - 20 mg/dL   Creatinine, Ser 4.540.66 0.44 - 1.00 mg/dL   Calcium 9.0 8.9 - 09.810.3 mg/dL   GFR  calc non Af Amer >60 >60 mL/min   GFR calc Af Amer >60 >60 mL/min   Anion gap 7 5 - 15  CBC with Differential  Result Value Ref Range   WBC 7.2 4.0 - 10.5 K/uL   RBC 4.07 3.87 - 5.11 MIL/uL   Hemoglobin 12.6 12.0 - 15.0 g/dL   HCT 09.6 04.5 - 40.9 %   MCV 94.8 78.0 - 100.0 fL   MCH 31.0 26.0 - 34.0 pg   MCHC 32.6 30.0 - 36.0 g/dL   RDW 81.1 91.4 - 78.2 %   Platelets 364 150 - 400 K/uL   Neutrophils Relative % 42 %   Neutro Abs 3.0 1.7 - 7.7 K/uL   Lymphocytes Relative 44 %   Lymphs Abs 3.2 0.7 - 4.0 K/uL   Monocytes Relative 9 %   Monocytes Absolute 0.6 0.1 - 1.0 K/uL   Eosinophils Relative 4 %   Eosinophils Absolute 0.3 0.0 - 0.7 K/uL   Basophils Relative 1 %   Basophils Absolute 0.1 0.0 - 0.1 K/uL  TSH  Result Value Ref Range   TSH 2.304 0.350 - 4.500 uIU/mL   Laboratory interpretation all normal except hyperglycemia    EKG  EKG Interpretation None       Radiology No results found.  Procedures Procedures (including critical care time)  Medications Ordered in ED Medications  hydrOXYzine (ATARAX/VISTARIL) tablet 25 mg (25 mg Oral Given 12/07/16 0416)     Initial Impression / Assessment and Plan / ED Course  I have reviewed the triage vital signs and the nursing notes.  Pertinent labs & imaging results that were available during my care of the patient were reviewed by me and  considered in my medical decision making (see chart for details).    Patient seems very anxious.  Her symptoms sound like anxiety.  She may have taken a extra thyroid pill which could make her feel a little bit anxious but she does not have any tachycardia.  Basic blood work was done.  Recheck at 4:30 AM patient now clarifies when she woke up she felt "swimmy headed" and then she started having shaking and then she had trouble swallowing.  She states she had trouble swallowing the pill she was given however she normally has trouble swallowing her pills and it is not unusual to have pills were to get stuck.  Her son-in-law states her voice is normal.  We discussed her blood work that is back, we are still waiting for her thyroid test return.  At this point I think patient will be able to be discharged home.  At first I thought she may have been having angioedema from her ACE inhibitor however this seems to be more anxiety related.  She probably should follow-up with ENT and a gastroenterologist to make sure she does not have a esophageal stricture or some other swallowing problems.  05:00 AM all labs are normal, she seems improved from what she described at home, patient is being discharged.   Final Clinical Impressions(s) / ED Diagnoses   Final diagnoses:  Light headedness  Dysphagia, unspecified type    ED Discharge Orders    None      Plan discharge  Devoria Albe, MD, Concha Pyo, MD 12/07/16 [phone]

## 2016-12-07 NOTE — Discharge Instructions (Signed)
Please let your medical provider know about the difficulty you have with swallowing pills and feeling your throat closes up when you get anxious or nervous. You may need to be evaluated by a gastroenterologist or an ENT.  Return to the ED if you get swelling of your lips, tongue, or struggle to breathe.

## 2016-12-07 NOTE — ED Triage Notes (Signed)
Pt states she woke up around 2am feeling sob and "swimmy headed" and states her throat felt "stopped up"

## 2016-12-21 ENCOUNTER — Encounter: Payer: Self-pay | Admitting: Physician Assistant

## 2016-12-21 ENCOUNTER — Ambulatory Visit: Payer: Self-pay | Admitting: Physician Assistant

## 2016-12-21 VITALS — BP 122/76 | HR 64 | Temp 97.0°F | Ht 62.5 in | Wt 204.0 lb

## 2016-12-21 DIAGNOSIS — F17210 Nicotine dependence, cigarettes, uncomplicated: Secondary | ICD-10-CM

## 2016-12-21 DIAGNOSIS — J069 Acute upper respiratory infection, unspecified: Secondary | ICD-10-CM

## 2016-12-21 DIAGNOSIS — R1310 Dysphagia, unspecified: Secondary | ICD-10-CM

## 2016-12-21 DIAGNOSIS — K219 Gastro-esophageal reflux disease without esophagitis: Secondary | ICD-10-CM

## 2016-12-21 NOTE — Patient Instructions (Addendum)
mucinex  Increase fluids Gargle warm salt water No smoking  ---------------    Sore Throat A sore throat is pain, burning, irritation, or scratchiness in the throat. When you have a sore throat, you may feel pain or tenderness in your throat when you swallow or talk. Many things can cause a sore throat, including:  An infection.  Seasonal allergies.  Dryness in the air.  Irritants, such as smoke or pollution.  Gastroesophageal reflux disease (GERD).  A tumor.  A sore throat is often the first sign of another sickness. It may happen with other symptoms, such as coughing, sneezing, fever, and swollen neck glands. Most sore throats go away without medical treatment. Follow these instructions at home:  Take over-the-counter medicines only as told by your health care provider.  Drink enough fluids to keep your urine clear or pale yellow.  Rest as needed.  To help with pain, try: ? Sipping warm liquids, such as broth, herbal tea, or warm water. ? Eating or drinking cold or frozen liquids, such as frozen ice pops. ? Gargling with a salt-water mixture 3-4 times a day or as needed. To make a salt-water mixture, completely dissolve -1 tsp of salt in 1 cup of warm water. ? Sucking on hard candy or throat lozenges. ? Putting a cool-mist humidifier in your bedroom at night to moisten the air. ? Sitting in the bathroom with the door closed for 5-10 minutes while you run hot water in the shower.  Do not use any tobacco products, such as cigarettes, chewing tobacco, and e-cigarettes. If you need help quitting, ask your health care provider. Contact a health care provider if:  You have a fever for more than 2-3 days.  You have symptoms that last (are persistent) for more than 2-3 days.  Your throat does not get better within 7 days.  You have a fever and your symptoms suddenly get worse. Get help right away if:  You have difficulty breathing.  You cannot swallow fluids, soft  foods, or your saliva.  You have increased swelling in your throat or neck.  You have persistent nausea and vomiting. This information is not intended to replace advice given to you by your health care provider. Make sure you discuss any questions you have with your health care provider. Document Released: 01/27/2004 Document Revised: 08/15/2015 Document Reviewed: 10/09/2014 Elsevier Interactive Patient Education  [employer]2018 Elsevier Inc.

## 2016-12-21 NOTE — Progress Notes (Signed)
BP 122/76 (BP Location: Left Arm, Patient Position: Sitting, Cuff Size: Large)   Pulse 64   Temp (!) 97 F (36.1 C) (Other (Comment))   Ht 5' 2.5" (1.588 m)   Wt 204 lb (92.5 kg)   SpO2 98%   BMI 36.72 kg/m    Subjective:    Patient ID: Alyssa [name], female    DOB: 06/15/1955, 61 y.o.   MRN: [id number]  HPI: Alyssa Flemingsina M Swarm is a 61 y.o. female presenting on 12/21/2016 for Sore Throat ("throat has been sore since Monday, hurts to swallow, feels like something is in throat, is better today, and also has nausea")   HPI   Chief Complaint  Patient presents with  . Sore Throat    "throat has been sore since Monday, hurts to swallow, feels like something is in throat, is better today, and also has nausea"     Pt complains of ST and mucus.  Also her "eyes are dead".   The eyes "just feel tired".  They don't feels like they open all the way.  They are blurry.  Her eyes are not related to her ST.   She also has nasal congestion and mild cough.  She continues to smoke but says she has cut back a lot because she can't smoke at work.  Pt was seen in ER 12/07/16 - mostly anxiety but also trouble swallowing her pills.   Pt says she still has trouble swallowing pills.  She hasn't had problems with swallowing her foods.   Relevant past medical, surgical, family and social history reviewed and updated as indicated. Interim medical history since our last visit reviewed. Allergies and medications reviewed and updated.   Current Outpatient Medications:  .  aspirin 81 MG tablet, Take 81 mg by mouth daily., Disp: , Rfl:  .  atorvastatin (LIPITOR) 40 MG tablet, Take 1 tablet (40 mg total) by mouth daily., Disp: 90 tablet, Rfl: 3 .  levothyroxine (SYNTHROID, LEVOTHROID) 125 MCG tablet, TAKE 1 TABLET BY MOUTH ONCE DAILY, Disp: 30 tablet, Rfl: 4 .  lisinopril-hydrochlorothiazide (PRINZIDE,ZESTORETIC) 20-12.5 MG tablet, TAKE 1 TABLET BY MOUTH ONCE DAILY, Disp: 30 tablet, Rfl: 4 .  omeprazole (PRILOSEC) 40  MG capsule, Take 1 capsule (40 mg total) by mouth daily., Disp: 90 capsule, Rfl: 2  Review of Systems  Constitutional: Negative for appetite change, chills, diaphoresis, fatigue, fever and unexpected weight change.  HENT: Positive for sore throat and trouble swallowing. Negative for congestion, drooling, ear pain, facial swelling, hearing loss, mouth sores, sneezing and voice change.   Eyes: Positive for visual disturbance. Negative for pain, discharge, redness and itching.  Respiratory: Positive for choking. Negative for cough, shortness of breath and wheezing.   Cardiovascular: Negative for chest pain, palpitations and leg swelling.  Gastrointestinal: Negative for abdominal pain, blood in stool, constipation, diarrhea and vomiting.  Endocrine: Negative for cold intolerance, heat intolerance and polydipsia.  Genitourinary: Negative for decreased urine volume, dysuria and hematuria.  Musculoskeletal: Negative for arthralgias, back pain and gait problem.  Skin: Negative for rash.  Allergic/Immunologic: Negative for environmental allergies.  Neurological: Positive for light-headedness. Negative for seizures, syncope and headaches.  Hematological: Negative for adenopathy.  Psychiatric/Behavioral: Negative for agitation, dysphoric mood and suicidal ideas. The patient is nervous/anxious.     Per HPI unless specifically indicated above     Objective:    BP 122/76 (BP Location: Left Arm, Patient Position: Sitting, Cuff Size: Large)   Pulse 64   Temp (!) 97 F (  36.1 C) (Other (Comment))   Ht 5' 2.5" (1.588 m)   Wt 204 lb (92.5 kg)   SpO2 98%   BMI 36.72 kg/m   Wt Readings from Last 3 Encounters:  12/21/16 204 lb (92.5 kg)  12/07/16 190 lb (86.2 kg)  11/19/16 190 lb (86.2 kg)    Physical Exam  Constitutional: She is oriented to person, place, and time. She appears well-developed and well-nourished.  HENT:  Head: Normocephalic and atraumatic.  Right Ear: Hearing, tympanic membrane,  external ear and ear canal normal.  Left Ear: Hearing, tympanic membrane, external ear and ear canal normal.  Nose: Rhinorrhea present.  Mouth/Throat: Uvula is midline and oropharynx is clear and moist. No oropharyngeal exudate.  Neck: Neck supple.  Cardiovascular: Normal rate and regular rhythm.  Pulmonary/Chest: Effort normal and breath sounds normal. She has no wheezes.  Abdominal: Soft. Bowel sounds are normal. She exhibits no mass. There is no hepatosplenomegaly. There is no tenderness.  Musculoskeletal: She exhibits no edema.  Lymphadenopathy:    She has no cervical adenopathy.  Neurological: She is alert and oriented to person, place, and time.  Skin: Skin is warm and dry.  Psychiatric: She has a normal mood and affect. Her behavior is normal.  Vitals reviewed.       Assessment & Plan:   Encounter Diagnoses  Name Primary?  . Viral upper respiratory tract infection Yes  . Pill dysphagia   . Cigarette nicotine dependence, uncomplicated   . Gastroesophageal reflux disease, esophagitis presence not specified      -counseled pt on warm salt water gargles and OTCs prn URI.  Also encouraged extra rest and smoking cessation -will Refer to GI for possible stricture -pt to continue her omeprazole -pt was given cone discount application -pt to follow up as scheduled.  RTO sooner prn worsening or new symptoms

## 2016-12-29 ENCOUNTER — Encounter: Payer: Self-pay | Admitting: Gastroenterology

## 2017-01-04 ENCOUNTER — Other Ambulatory Visit: Payer: Self-pay | Admitting: Physician Assistant

## 2017-01-04 MED ORDER — OMEPRAZOLE 40 MG PO CPDR: 40.0000 mg | DELAYED_RELEASE_CAPSULE | Freq: Every day | ORAL | 2 refills | Status: DC

## 2017-01-04 MED ORDER — ATORVASTATIN CALCIUM 40 MG PO TABS: 40.0000 mg | ORAL_TABLET | Freq: Every day | ORAL | 3 refills | Status: DC

## 2017-01-05 ENCOUNTER — Other Ambulatory Visit (HOSPITAL_COMMUNITY)
Admission: RE | Admit: 2017-01-05 | Discharge: 2017-01-05 | Disposition: A | Discharge status: Home / Self Care | Payer: Self-pay | Source: Ambulatory Visit | Attending: Physician Assistant | Admitting: Physician Assistant

## 2017-01-05 DIAGNOSIS — E785 Hyperlipidemia, unspecified: Secondary | ICD-10-CM | POA: Insufficient documentation

## 2017-01-05 DIAGNOSIS — I10 Essential (primary) hypertension: Secondary | ICD-10-CM | POA: Insufficient documentation

## 2017-01-05 DIAGNOSIS — E039 Hypothyroidism, unspecified: Secondary | ICD-10-CM | POA: Insufficient documentation

## 2017-01-05 LAB — COMPREHENSIVE METABOLIC PANEL
ALT: 22 U/L (ref 14–54)
AST: 23 U/L (ref 15–41)
Albumin: 4.2 g/dL (ref 3.5–5.0)
Alkaline Phosphatase: 85 U/L (ref 38–126)
Anion gap: 11 (ref 5–15)
BILIRUBIN TOTAL: 0.4 mg/dL (ref 0.3–1.2)
BUN: 13 mg/dL (ref 6–20)
CALCIUM: 9.5 mg/dL (ref 8.9–10.3)
CO2: 24 mmol/L (ref 22–32)
Chloride: 102 mmol/L (ref 101–111)
Creatinine, Ser: 0.78 mg/dL (ref 0.44–1.00)
GFR calc Af Amer: >60 mL/min (ref >60)
GLUCOSE: 105 mg/dL — AB (ref 65–99)
POTASSIUM: 4.2 mmol/L (ref 3.5–5.1)
Sodium: 137 mmol/L (ref 135–145)
TOTAL PROTEIN: 7.9 g/dL (ref 6.5–8.1)

## 2017-01-05 LAB — LIPID PANEL
CHOL/HDL RATIO: 6.4 ratio
Cholesterol: 250 mg/dL — ABNORMAL HIGH (ref 0–200)
HDL: 39 mg/dL — ABNORMAL LOW (ref >40)
LDL Cholesterol: 181 mg/dL — ABNORMAL HIGH (ref 0–99)
Triglycerides: 150 mg/dL — ABNORMAL HIGH (ref <150)
VLDL: 30 mg/dL (ref 0–40)

## 2017-01-05 LAB — TSH: TSH: 0.27 u[IU]/mL — ABNORMAL LOW (ref 0.350–4.500)

## 2017-01-09 ENCOUNTER — Ambulatory Visit: Payer: Self-pay | Admitting: Physician Assistant

## 2017-01-11 ENCOUNTER — Ambulatory Visit: Payer: Self-pay | Admitting: Physician Assistant

## 2017-01-11 ENCOUNTER — Encounter: Payer: Self-pay | Admitting: Physician Assistant

## 2017-01-11 VITALS — BP 113/69 | HR 60 | Temp 97.2°F | Ht 62.5 in | Wt 203.5 lb

## 2017-01-11 DIAGNOSIS — E785 Hyperlipidemia, unspecified: Secondary | ICD-10-CM

## 2017-01-11 DIAGNOSIS — E039 Hypothyroidism, unspecified: Secondary | ICD-10-CM

## 2017-01-11 DIAGNOSIS — I10 Essential (primary) hypertension: Secondary | ICD-10-CM

## 2017-01-11 DIAGNOSIS — F17210 Nicotine dependence, cigarettes, uncomplicated: Secondary | ICD-10-CM

## 2017-01-11 DIAGNOSIS — R1310 Dysphagia, unspecified: Secondary | ICD-10-CM

## 2017-01-11 DIAGNOSIS — K219 Gastro-esophageal reflux disease without esophagitis: Secondary | ICD-10-CM

## 2017-01-11 MED ORDER — ATORVASTATIN CALCIUM 40 MG PO TABS: 40.0000 mg | ORAL_TABLET | Freq: Every day | ORAL | 3 refills | Status: DC

## 2017-01-11 MED ORDER — LEVOTHYROXINE SODIUM 112 MCG PO TABS: 112.0000 ug | ORAL_TABLET | Freq: Every day | ORAL | 4 refills | Status: DC

## 2017-01-11 MED ORDER — OMEPRAZOLE 40 MG PO CPDR: 40.0000 mg | DELAYED_RELEASE_CAPSULE | Freq: Every day | ORAL | 2 refills | Status: DC

## 2017-01-11 NOTE — Patient Instructions (Signed)
Fat and Cholesterol Restricted Diet High levels of fat and cholesterol in your blood may lead to various health problems, such as diseases of the heart, blood vessels, gallbladder, liver, and pancreas. Fats are concentrated sources of energy that come in various forms. Certain types of fat, including saturated fat, may be harmful in excess. Cholesterol is a substance needed by your body in small amounts. Your body makes all the cholesterol it needs. Excess cholesterol comes from the food you eat. When you have high levels of cholesterol and saturated fat in your blood, health problems can develop because the excess fat and cholesterol will gather along the walls of your blood vessels, causing them to narrow. Choosing the right foods will help you control your intake of fat and cholesterol. This will help keep the levels of these substances in your blood within normal limits and reduce your risk of disease. What is my plan? Your health care provider recommends that you:  Limit your fat intake to ______% or less of your total calories per day.  Limit the amount of cholesterol in your diet to less than _________mg per day.  Eat 20-30 grams of fiber each day.  What types of fat should I choose?  Choose healthy fats more often. Choose monounsaturated and polyunsaturated fats, such as olive and canola oil, flaxseeds, walnuts, almonds, and seeds.  Eat more omega-3 fats. Good choices include salmon, mackerel, sardines, tuna, flaxseed oil, and ground flaxseeds. Aim to eat fish at least two times a week.  Limit saturated fats. Saturated fats are primarily found in animal products, such as meats, butter, and cream. Plant sources of saturated fats include palm oil, palm kernel oil, and coconut oil.  Avoid foods with partially hydrogenated oils in them. These contain trans fats. Examples of foods that contain trans fats are stick margarine, some tub margarines, cookies, crackers, and other baked goods. What  general guidelines do I need to follow? These guidelines for healthy eating will help you control your intake of fat and cholesterol:  Check food labels carefully to identify foods with trans fats or high amounts of saturated fat.  Fill one half of your plate with vegetables and green salads.  Fill one fourth of your plate with whole grains. Look for the word "whole" as the first word in the ingredient list.  Fill one fourth of your plate with lean protein foods.  Limit fruit to two servings a day. Choose fruit instead of juice.  Eat more foods that contain fiber, such as apples, broccoli, carrots, beans, peas, and barley.  Eat more home-cooked food and less restaurant, buffet, and fast food.  Limit or avoid alcohol.  Limit foods high in starch and sugar.  Limit fried foods.  Cook foods using methods other than frying. Baking, boiling, grilling, and broiling are all great options.  Lose weight if you are overweight. Losing just 5-10% of your initial body weight can help your overall health and prevent diseases such as diabetes and heart disease.  What foods can I eat? Grains  Whole grains, such as whole wheat or whole grain breads, crackers, cereals, and pasta. Unsweetened oatmeal, bulgur, barley, quinoa, or brown rice. Corn or whole wheat flour tortillas. Vegetables  Fresh or frozen vegetables (raw, steamed, roasted, or grilled). Green salads. Fruits  All fresh, canned (in natural juice), or frozen fruits. Meats and other protein foods  Ground beef (85% or leaner), grass-fed beef, or beef trimmed of fat. Skinless chicken or turkey. Ground chicken or turkey.   Pork trimmed of fat. All fish and seafood. Eggs. Dried beans, peas, or lentils. Unsalted nuts or seeds. Unsalted canned or dry beans. Dairy  Low-fat dairy products, such as skim or 1% milk, 2% or reduced-fat cheeses, low-fat ricotta or cottage cheese, or plain low-fat yo Fats and oils  Tub margarines without trans  fats. Light or reduced-fat mayonnaise and salad dressings. Avocado. Olive, canola, sesame, or safflower oils. Natural peanut or almond butter (choose ones without added sugar and oil). The items listed above may not be a complete list of recommended foods or beverages. Contact your dietitian for more options. Foods to avoid Grains  White bread. White pasta. White rice. Cornbread. Bagels, pastries, and croissants. Crackers that contain trans fat. Vegetables  White potatoes. Corn. Creamed or fried vegetables. Vegetables in a cheese sauce. Fruits  Dried fruits. Canned fruit in light or heavy syrup. Fruit juice. Meats and other protein foods  Fatty cuts of meat. Ribs, chicken wings, bacon, sausage, bologna, salami, chitterlings, fatback, hot dogs, bratwurst, and packaged luncheon meats. Liver and organ meats. Dairy  Whole or 2% milk, cream, half-and-half, and cream cheese. Whole milk cheeses. Whole-fat or sweetened yogurt. Full-fat cheeses. Nondairy creamers and whipped toppings. Processed cheese, cheese spreads, or cheese curds. Beverages  Alcohol. Sweetened drinks (such as sodas, lemonade, and fruit drinks or punches). Fats and oils  Butter, stick margarine, lard, shortening, ghee, or bacon fat. Coconut, palm kernel, or palm oils. Sweets and desserts  Corn syrup, sugars, honey, and molasses. Candy. Jam and jelly. Syrup. Sweetened cereals. Cookies, pies, cakes, donuts, muffins, and ice cream. The items listed above may not be a complete list of foods and beverages to avoid. Contact your dietitian for more information. This information is not intended to replace advice given to you by your health care provider. Make sure you discuss any questions you have with your health care provider. Document Released: 12/19/2004 Document Revised: 01/09/2014 Document Reviewed: 03/19/2013 Elsevier Interactive Patient Education  2018 [employer] Inc.  

## 2017-01-11 NOTE — Progress Notes (Signed)
BP 113/69 (BP Location: Right Arm, Patient Position: Sitting, Cuff Size: Large)   Pulse 60   Temp (!) 97.2 F (36.2 C) (Other (Comment))   Ht 5' 2.5" (1.588 m)   Wt 203 lb 8 oz (92.3 kg)   SpO2 100%   BMI 36.63 kg/m    Subjective:    Patient ID: Alyssa [name], female    DOB: [birth year], 62 y.o.   MRN: [id number]  HPI: Alyssa [name] is a 62 y.o. female presenting on 01/11/2017 for Thyroid Problem; Hyperlipidemia; Gastroesophageal Reflux; and Hypertension   HPI   Pt is still working at [employer]  She is still smoking  She is doing well.  She has appt later this month with GI for dysphagia.  Her only new complaint is some R knee pain at times- no injury  Relevant past medical, surgical, family and social history reviewed and updated as indicated. Interim medical history since our last visit reviewed. Allergies and medications reviewed and updated.   Current Outpatient Medications:  .  aspirin 81 MG tablet, Take 81 mg by mouth daily., Disp: , Rfl:  .  atorvastatin (LIPITOR) 40 MG tablet, Take 1 tablet (40 mg total) by mouth daily., Disp: 30 tablet, Rfl: 3 .  levothyroxine (SYNTHROID, LEVOTHROID) 125 MCG tablet, TAKE 1 TABLET BY MOUTH ONCE DAILY, Disp: 30 tablet, Rfl: 4 .  lisinopril-hydrochlorothiazide (PRINZIDE,ZESTORETIC) 20-12.5 MG tablet, TAKE 1 TABLET BY MOUTH ONCE DAILY, Disp: 30 tablet, Rfl: 4 .  omeprazole (PRILOSEC) 40 MG capsule, Take 1 capsule (40 mg total) by mouth daily., Disp: 30 capsule, Rfl: 2   Review of Systems  Constitutional: Positive for fatigue. Negative for appetite change, chills, diaphoresis, fever and unexpected weight change.  HENT: Negative for congestion, drooling, ear pain, facial swelling, hearing loss, mouth sores, sneezing, sore throat, trouble swallowing and voice change.   Eyes: Positive for visual disturbance. Negative for pain, discharge, redness and itching.  Respiratory: Negative for cough, choking, shortness of breath and wheezing.    Cardiovascular: Negative for chest pain, palpitations and leg swelling.  Gastrointestinal: Negative for abdominal pain, blood in stool, constipation, diarrhea and vomiting.  Endocrine: Negative for cold intolerance, heat intolerance and polydipsia.  Genitourinary: Negative for decreased urine volume, dysuria and hematuria.  Musculoskeletal: Positive for arthralgias. Negative for back pain and gait problem.  Skin: Negative for rash.  Allergic/Immunologic: Negative for environmental allergies.  Neurological: Negative for seizures, syncope, light-headedness and headaches.  Hematological: Negative for adenopathy.  Psychiatric/Behavioral: Negative for agitation, dysphoric mood and suicidal ideas. The patient is not nervous/anxious.     Per HPI unless specifically indicated above     Objective:    BP 113/69 (BP Location: Right Arm, Patient Position: Sitting, Cuff Size: Large)   Pulse 60   Temp (!) 97.2 F (36.2 C) (Other (Comment))   Ht 5' 2.5" (1.588 m)   Wt 203 lb 8 oz (92.3 kg)   SpO2 100%   BMI 36.63 kg/m   Wt Readings from Last 3 Encounters:  01/11/17 203 lb 8 oz (92.3 kg)  12/21/16 204 lb (92.5 kg)  12/07/16 190 lb (86.2 kg)    Physical Exam  Constitutional: She is oriented to person, place, and time. She appears well-developed and well-nourished.  HENT:  Head: Normocephalic and atraumatic.  Neck: Neck supple.  Cardiovascular: Normal rate and regular rhythm.  Pulmonary/Chest: Effort normal and breath sounds normal.  Abdominal: Soft. Bowel sounds are normal. She exhibits no mass. There is no hepatosplenomegaly. There is no  tenderness.  Musculoskeletal: She exhibits no edema.       Right knee: She exhibits normal range of motion, no swelling, no effusion and no ecchymosis. No tenderness found.  Lymphadenopathy:    She has no cervical adenopathy.  Neurological: She is alert and oriented to person, place, and time.  Skin: Skin is warm and dry.  Psychiatric: She has a normal  mood and affect. Her behavior is normal.  Vitals reviewed.   Results for orders placed or performed during the hospital encounter of 01/05/17  TSH  Result Value Ref Range   TSH 0.270 (L) 0.350 - 4.500 uIU/mL  Comprehensive Metabolic Panel (CMET)  Result Value Ref Range   Sodium 137 135 - 145 mmol/L   Potassium 4.2 3.5 - 5.1 mmol/L   Chloride 102 101 - 111 mmol/L   CO2 24 22 - 32 mmol/L   Glucose, Bld 105 (H) 65 - 99 mg/dL   BUN 13 6 - 20 mg/dL   Creatinine, Ser 6.960.78 0.44 - 1.00 mg/dL   Calcium 9.5 8.9 - 29.510.3 mg/dL   Total Protein 7.9 6.5 - 8.1 g/dL   Albumin 4.2 3.5 - 5.0 g/dL   AST 23 15 - 41 U/L   ALT 22 14 - 54 U/L   Alkaline Phosphatase 85 38 - 126 U/L   Total Bilirubin 0.4 0.3 - 1.2 mg/dL   GFR calc non Af Amer >60 >60 mL/min   GFR calc Af Amer >60 >60 mL/min   Anion gap 11 5 - 15  Lipid Profile  Result Value Ref Range   Cholesterol 250 (H) 0 - 200 mg/dL   Triglycerides 284150 (H) <150 mg/dL   HDL 39 (L) >13>40 mg/dL   Total CHOL/HDL Ratio 6.4 RATIO   VLDL 30 0 - 40 mg/dL   LDL Cholesterol 244181 (H) 0 - 99 mg/dL      Assessment & Plan:    Encounter Diagnoses  Name Primary?  . Hypothyroidism, unspecified type Yes  . Hyperlipidemia, unspecified hyperlipidemia type   . Essential hypertension, benign   . Cigarette nicotine dependence, uncomplicated   . Gastroesophageal reflux disease, esophagitis presence not specified   . Pill dysphagia     -reviewed labs with pt -will Reduce thyroid medication -Pt on q 2 year mammogram so she is not due for that -discussed high cholesterol.  Pt does not want increase in medication.  She will watch low-fat diet -encouraged regular exercise -counseled smoking cessation -Pt to follow up 3 month. RTO sooner prn

## 2017-02-01 ENCOUNTER — Other Ambulatory Visit: Payer: Self-pay

## 2017-02-01 ENCOUNTER — Ambulatory Visit (INDEPENDENT_AMBULATORY_CARE_PROVIDER_SITE_OTHER): Payer: Self-pay | Admitting: Gastroenterology

## 2017-02-01 ENCOUNTER — Encounter: Payer: Self-pay | Admitting: Gastroenterology

## 2017-02-01 ENCOUNTER — Encounter: Payer: Self-pay | Admitting: *Deleted

## 2017-02-01 DIAGNOSIS — K219 Gastro-esophageal reflux disease without esophagitis: Secondary | ICD-10-CM

## 2017-02-01 DIAGNOSIS — R1310 Dysphagia, unspecified: Secondary | ICD-10-CM

## 2017-02-01 HISTORY — DX: Dysphagia, unspecified: R13.10

## 2017-02-01 NOTE — Progress Notes (Signed)
Subjective:    Patient ID: Alyssa [name], female    DOB: 08/12/1955, 62 y.o.   MRN: [id number]  Jacquelin HawkingMcElroy, Shannon, PA-C   HPI CHEW UP PILLS AND HARD TO SWALLOW SOLIDS. CIGS: < 1 PK/DAY SINCE AGE 62.  NO ETOH. NO BC/GOODY POWDERS, IBUPROFEN/MOTRIN, OR NAPROXEN/ALEVE. BMs: 2-3 TIMES A WEEK. HEARTBURN CONTROLLED WITH OMEPRAZOLE. THREW UP AFTER 2ND CARPAL TUNNEL SURGERY.  PT DENIES FEVER, CHILLS, HEMATOCHEZIA, HEMATEMESIS, nausea, vomiting, melena, diarrhea, CHEST PAIN, SHORTNESS OF BREATH, CHANGE IN BOWEL IN HABITS, constipation, abdominal pain, problems swallowing, problems with sedation, heartburn or indigestion.  Past Medical History:  Diagnosis Date  . Diabetes mellitus without complication (HCC)   . GERD (gastroesophageal reflux disease)   . Hyperlipidemia   . Hypertension   . Thyroid disease    Past Surgical History:  Procedure Laterality Date  . CARPAL TUNNEL RELEASE    . TUBAL LIGATION       Allergies  Allergen Reactions  . Sulfa Antibiotics Swelling and Rash    Current Outpatient Medications  Medication Sig Dispense Refill  . aspirin 81 MG tablet Take 81 mg by mouth daily.    Marland Kitchen. atorvastatin (LIPITOR) 40 MG tablet Take 1 tablet (40 mg total) by mouth daily.    Marland Kitchen. SYNTHROID 112 MCG tablet Take 1 tablet (112 mcg total) by mouth daily.    Marland Kitchen. PRINIZIDE 20-12.5 MG tablet TAKE 1 TABLET BY MOUTH ONCE DAILY    . omeprazole (PRILOSEC) 40 MG capsule Take 1 capsule (40 mg total) by mouth daily.     Family History  Problem Relation Age of Onset  . Alzheimer's disease Mother   . Alcohol abuse Father   . Colon cancer Neg Hx   . Colon polyps Neg Hx    Social History   Socioeconomic History  . Marital status: Widowed    Spouse name: None  . Number of children: None  . Years of education: None  . Highest education level: None  Social Needs  . Financial resource strain: None  . Food insecurity - worry: None  . Food insecurity - inability: None  . Transportation needs -  medical: None  . Transportation needs - non-medical: None  Occupational History  . None  Tobacco Use  . Smoking status: Current Every Day Smoker    Packs/day: 0.50    Years: 42.00    Pack years: 21.00    Types: Cigarettes  . Smokeless tobacco: Never Used  Substance and Sexual Activity  . Alcohol use: No  . Drug use: No  . Sexual activity: Yes  Other Topics Concern  . None  Social History Narrative   WORKS [employer]. DRAWS A WIDOWS PENSION. HUSBAND DIED IN 2007 AND DAUGHTER WAS 9 YO. KIDS: TWO(ONE: 42, ONE: 27)   Review of Systems PER HPI OTHERWISE ALL SYSTEMS ARE NEGATIVE.    Objective:   Physical Exam  Constitutional: She is oriented to person, place, and time. She appears well-developed and well-nourished. No distress.  HENT:  Head: Normocephalic and atraumatic.  Mouth/Throat: Oropharynx is clear and moist. No oropharyngeal exudate.  Eyes: Pupils are equal, round, and reactive to light. No scleral icterus.  Neck: Normal range of motion. Neck supple.  Cardiovascular: Normal rate, regular rhythm and normal heart sounds.  Pulmonary/Chest: Effort normal and breath sounds normal. No respiratory distress.  Abdominal: Soft. Bowel sounds are normal. She exhibits no distension. There is no tenderness.  Musculoskeletal: She exhibits no edema.  Lymphadenopathy:    She  has no cervical adenopathy.  Neurological: She is alert and oriented to person, place, and time.  NO FOCAL DEFICITS  Psychiatric: She has a normal mood and affect.  Vitals reviewed.     Assessment & Plan:

## 2017-02-01 NOTE — Progress Notes (Signed)
cc'ed to pcp °

## 2017-02-01 NOTE — Assessment & Plan Note (Signed)
SYMPTOMS CONTROLLED/RESOLVED.  CONTINUE TO MONITOR SYMPTOMS. CONTINUE OMEPRAZOLE.  TAKE 30 MINUTES PRIOR TO YOUR FIRST MEAL. FOLLOW UP IN 4 MOS.   

## 2017-02-01 NOTE — Assessment & Plan Note (Signed)
SYMPTOMS NOT CONTROLLED IN PT WITH 45 PK*YR HISTORY. DIFFERENTIAL DIAGNOSIS INCLUDES: PEPTIC STRICTURE, PRIMARY ESOPHAGEAL MOTILITY DISORDER,OR 2o ESOPHAGEAL MOTILITY DISORDER DUE TO UNCONTROLLED REFLUX, LESS LIKELY ESOPHAGEAL OR THROAT CANCER.   DRINK WATER TO KEEP YOUR URINE LIGHT YELLOW. FOLLOW A SOFT MECHANICAL DIET.  MEATS SHOULD BE CHOPPED ONLY. DO NOT EAT CHUNKS OF ANYTHING.  HANDOUT GIVEN. CONTINUE OMEPRAZOLE.  TAKE 30 MINUTES PRIOR TO YOUR FIRST MEAL. COMPLETE UPPER ENDOSCOPY/DILATION IN 3-4 WEEKS. PT REQUEST THUR OR FRI APPT. WILL PUT ON A CANCELLATION LIST AND GET IT DONE SOONER IF POSSIBLE.DISCUSSED PROCEDURE, BENEFITS, & RISKS: < 1% chance of medication reaction, bleeding, OR perforation. PHENERGAN 12.5 MG IV IN PREOP.  FOLLOW UP IN 4 MOS.

## 2017-02-01 NOTE — Progress Notes (Signed)
ON RECALL  °

## 2017-02-01 NOTE — Patient Instructions (Addendum)
DRINK WATER TO KEEP YOUR URINE LIGHT YELLOW.  FOLLOW A SOFT MECHANICAL DIET.  MEATS SHOULD BE CHOPPED ONLY. DO NOT EAT CHUNKS OF ANYTHING. SEE INFO BELOW.  CONTINUE OMEPRAZOLE.  TAKE 30 MINUTES PRIOR TO YOUR FIRST MEAL.  COMPLETE UPPER ENDOSCOPY TO DILATE YOUR ESOPHAGUS IN 3-4 WEEKS. WE WILL PUT YOU ON A CANCELLATION LIST AND GET YOU DONE SOONER IF POSSIBLE.  FOLLOW UP IN 4 MOS.    SOFT MECHANICAL DIET This SOFT MECHANICAL DIET is restricted to:  Foods that are moist, soft-textured, and easy to chew and swallow.   Meats that are ground or are minced no larger than one-quarter inch pieces. Meats are moist with gravy or sauce added.   Foods that do not include bread or bread-like textures except soft pancakes, well-moistened with syrup or sauce.   Textures with some chewing ability required.   Casseroles without rice.   Cooked vegetables that are less than half an inch in size and easily mashed with a fork. No cooked corn, peas, broccoli, cauliflower, cabbage, Brussels sprouts, asparagus, or other fibrous, non-tender or rubbery cooked vegetables.   Canned fruit except for pineapple. Fruit must be cut into pieces no larger than half an inch in size.   Foods that do not include nuts, seeds, coconut, or sticky textures.   FOOD TEXTURES FOR DYSPHAGIA DIET LEVEL 2 -SOFT MECHANICAL DIET (includes all foods on Dysphagia Diet Level 1 - Pureed, in addition to the foods listed below)  FOOD GROUP: Breads. RECOMMENDED: Soft pancakes, well-moistened with syrup or sauce.  AVOID: All others.  FOOD GROUP: Cereals.  RECOMMENDED: Cooked cereals with little texture, including oatmeal. Unprocessed wheat bran stirred into cereals for bulk. Note: If thin liquids are restricted, it is important that all of the liquid is absorbed into the cereal.  AVOID: All dry cereals and any cooked cereals that may contain flax seeds or other seeds or nuts. Whole-grain, dry, or coarse cereals. Cereals with nuts,  seeds, dried fruit, and/or coconut.  FOOD GROUP: Desserts. RECOMMENDED: Pudding, custard. Soft fruit pies with bottom crust only. Canned fruit (excluding pineapple). Soft, moist cakes with icing.Frozen malts, milk shakes, frozen yogurt, eggnog, nutritional supplements, ice cream, sherbet, regular or sugar-free gelatin, or any foods that become thin liquid at either room (70 F) or body temperature (98 F).  AVOID: Dry, coarse cakes and cookies. Anything with nuts, seeds, coconut, pineapple, or dried fruit. Breakfast yogurt with nuts. Rice or bread pudding.  FOOD GROUP: Fats. RECOMMENDED: Butter, margarine, cream for cereal (depending on liquid consistency recommendations), gravy, cream sauces, sour cream, sour cream dips with soft additives, mayonnaise, salad dressings, cream cheese, cream cheese spreads with soft additives, whipped toppings.  AVOID: All fats with coarse or chunky additives.  FOOD GROUP: Fruits. RECOMMENDED: Soft drained, canned, or cooked fruits without seeds or skin. Fresh soft and ripe banana. Fruit juices with a small amount of pulp. If thin liquids are restricted, fruit juices should be thickened to appropriate consistency.  AVOID: Fresh or frozen fruits. Cooked fruit with skin or seeds. Dried fruits. Fresh, canned, or cooked pineapple.  FOOD GROUP: Meats and Meat Substitutes. (Meat pieces should not exceed 1/4 of an inch cube and should be tender.) RECOMMENDED: Moistened ground or cooked meat, poultry, or fish. Moist ground or tender meat may be served with gravy or sauce. Casseroles without rice. Moist macaroni and cheese, well-cooked pasta with meat sauce, tuna noodle casserole, soft, moist lasagna. Moist meatballs, meatloaf, or fish loaf. Protein salads, such as tuna  or egg without large chunks, celery, or onion. Cottage cheese, smooth quiche without large chunks. Poached, scrambled, or soft-cooked eggs (egg yolks should not be "runny" but should be moist and able to be  mashed with butter, margarine, or other moisture added to them). (Cook eggs to 160 F or use pasteurized eggs for safety.) Souffls may have small, soft chunks. Tofu. Well-cooked, slightly mashed, moist legumes, such as baked beans. All meats or protein substitutes should be served with sauces or moistened to help maintain cohesiveness in the oral cavity.  AVOID: Dry meats, tough meats (such as bacon, sausage, hot dogs, bratwurst). Dry casseroles or casseroles with rice or large chunks. Peanut butter. Cheese slices and cubes. Hard-cooked or crisp fried eggs. Sandwiches.Pizza.  FOOD GROUP: Potatoes and Starches. RECOMMENDED: Well-cooked, moistened, boiled, baked, or mashed potatoes. Well-cooked shredded hash brown potatoes that are not crisp. (All potatoes need to be moist and in sauces.)Well-cooked noodles in sauce. Spaetzel or soft dumplings that have been moistened with butter or gravy.  AVOID: Potato skins and chips. Fried or French-fried potatoes. Rice.  FOOD GROUP: Soups. RECOMMENDED: Soups with easy-to-chew or easy-to-swallow meats or vegetables: Particle sizes in soups should be less than 1/2 inch. Soups will need to be thickened to appropriate consistency if soup is thinner than prescribed liquid consistency.  AVOID: Soups with large chunks of meat and vegetables. Soups with rice, corn, peas.  FOOD GROUP: Vegetables. RECOMMENDED: All soft, well-cooked vegetables. Vegetables should be less than a half inch. Should be easily mashed with a fork.  AVOID: Cooked corn and peas. Broccoli, cabbage, Brussels sprouts, asparagus, or other fibrous, non-tender or rubbery cooked vegetables.  FOOD GROUP: Miscellaneous. RECOMMENDED: Jams and preserves without seeds, jelly. Sauces, salsas, etc., that may have small tender chunks less than 1/2 inch. Soft, smooth chocolate bars that are easily chewed.  AVOID: Seeds, nuts, coconut, or sticky foods. Chewy candies such as caramels or licorice.

## 2017-02-01 NOTE — H&P (View-Only) (Signed)
 Subjective:    Patient ID: Alyssa [name], female    DOB: 11/20/1955, 61 y.o.   MRN: [id number]  McElroy, Shannon, PA-C   HPI CHEW UP PILLS AND HARD TO SWALLOW SOLIDS. CIGS: < 1 PK/DAY SINCE AGE 16.  NO ETOH. NO BC/GOODY POWDERS, IBUPROFEN/MOTRIN, OR NAPROXEN/ALEVE. BMs: 2-3 TIMES A WEEK. HEARTBURN CONTROLLED WITH OMEPRAZOLE. THREW UP AFTER 2ND CARPAL TUNNEL SURGERY.  PT DENIES FEVER, CHILLS, HEMATOCHEZIA, HEMATEMESIS, nausea, vomiting, melena, diarrhea, CHEST PAIN, SHORTNESS OF BREATH, CHANGE IN BOWEL IN HABITS, constipation, abdominal pain, problems swallowing, problems with sedation, heartburn or indigestion.  Past Medical History:  Diagnosis Date  . Diabetes mellitus without complication (HCC)   . GERD (gastroesophageal reflux disease)   . Hyperlipidemia   . Hypertension   . Thyroid disease    Past Surgical History:  Procedure Laterality Date  . CARPAL TUNNEL RELEASE    . TUBAL LIGATION       Allergies  Allergen Reactions  . Sulfa Antibiotics Swelling and Rash    Current Outpatient Medications  Medication Sig Dispense Refill  . aspirin 81 MG tablet Take 81 mg by mouth daily.    . atorvastatin (LIPITOR) 40 MG tablet Take 1 tablet (40 mg total) by mouth daily.    . SYNTHROID 112 MCG tablet Take 1 tablet (112 mcg total) by mouth daily.    . PRINIZIDE 20-12.5 MG tablet TAKE 1 TABLET BY MOUTH ONCE DAILY    . omeprazole (PRILOSEC) 40 MG capsule Take 1 capsule (40 mg total) by mouth daily.     Family History  Problem Relation Age of Onset  . Alzheimer's disease Mother   . Alcohol abuse Father   . Colon cancer Neg Hx   . Colon polyps Neg Hx    Social History   Socioeconomic History  . Marital status: Widowed    Spouse name: None  . Number of children: None  . Years of education: None  . Highest education level: None  Social Needs  . Financial resource strain: None  . Food insecurity - worry: None  . Food insecurity - inability: None  . Transportation needs -  medical: None  . Transportation needs - non-medical: None  Occupational History  . None  Tobacco Use  . Smoking status: Current Every Day Smoker    Packs/day: 0.50    Years: 42.00    Pack years: 21.00    Types: Cigarettes  . Smokeless tobacco: Never Used  Substance and Sexual Activity  . Alcohol use: No  . Drug use: No  . Sexual activity: Yes  Other Topics Concern  . None  Social History Narrative   WORKS AT [employer]. DRAWS A WIDOWS PENSION. HUSBAND DIED IN 2007 AND DAUGHTER WAS 9 YO. KIDS: TWO(ONE: 42, ONE: 27)   Review of Systems PER HPI OTHERWISE ALL SYSTEMS ARE NEGATIVE.    Objective:   Physical Exam  Constitutional: She is oriented to person, place, and time. She appears well-developed and well-nourished. No distress.  HENT:  Head: Normocephalic and atraumatic.  Mouth/Throat: Oropharynx is clear and moist. No oropharyngeal exudate.  Eyes: Pupils are equal, round, and reactive to light. No scleral icterus.  Neck: Normal range of motion. Neck supple.  Cardiovascular: Normal rate, regular rhythm and normal heart sounds.  Pulmonary/Chest: Effort normal and breath sounds normal. No respiratory distress.  Abdominal: Soft. Bowel sounds are normal. She exhibits no distension. There is no tenderness.  Musculoskeletal: She exhibits no edema.  Lymphadenopathy:    She   has no cervical adenopathy.  Neurological: She is alert and oriented to person, place, and time.  NO FOCAL DEFICITS  Psychiatric: She has a normal mood and affect.  Vitals reviewed.     Assessment & Plan:

## 2017-02-12 ENCOUNTER — Other Ambulatory Visit: Payer: Self-pay | Admitting: Physician Assistant

## 2017-02-12 MED ORDER — LEVOTHYROXINE SODIUM 112 MCG PO TABS: 112.0000 ug | ORAL_TABLET | Freq: Every day | ORAL | 1 refills | Status: DC

## 2017-02-12 MED ORDER — OMEPRAZOLE 40 MG PO CPDR: 40.0000 mg | DELAYED_RELEASE_CAPSULE | Freq: Every day | ORAL | 2 refills | Status: DC

## 2017-02-12 MED ORDER — LISINOPRIL-HYDROCHLOROTHIAZIDE 20-12.5 MG PO TABS: 1.0000 | ORAL_TABLET | Freq: Every day | ORAL | 2 refills | Status: DC

## 2017-02-12 MED ORDER — ATORVASTATIN CALCIUM 40 MG PO TABS: 40.0000 mg | ORAL_TABLET | Freq: Every day | ORAL | 2 refills | Status: DC

## 2017-03-02 ENCOUNTER — Ambulatory Visit (HOSPITAL_COMMUNITY)
Admission: RE | Admit: 2017-03-02 | Discharge: 2017-03-02 | Disposition: A | Discharge status: Home / Self Care | Payer: Self-pay | Source: Ambulatory Visit | Attending: Gastroenterology | Admitting: Gastroenterology

## 2017-03-02 ENCOUNTER — Encounter (HOSPITAL_COMMUNITY)
Admission: RE | Disposition: A | Discharge status: Home / Self Care | Payer: Self-pay | Source: Ambulatory Visit | Attending: Gastroenterology

## 2017-03-02 ENCOUNTER — Encounter (HOSPITAL_COMMUNITY): Payer: Self-pay | Admitting: *Deleted

## 2017-03-02 ENCOUNTER — Other Ambulatory Visit: Payer: Self-pay

## 2017-03-02 DIAGNOSIS — E119 Type 2 diabetes mellitus without complications: Secondary | ICD-10-CM | POA: Insufficient documentation

## 2017-03-02 DIAGNOSIS — Z811 Family history of alcohol abuse and dependence: Secondary | ICD-10-CM | POA: Insufficient documentation

## 2017-03-02 DIAGNOSIS — E079 Disorder of thyroid, unspecified: Secondary | ICD-10-CM | POA: Insufficient documentation

## 2017-03-02 DIAGNOSIS — Z79890 Hormone replacement therapy (postmenopausal): Secondary | ICD-10-CM | POA: Insufficient documentation

## 2017-03-02 DIAGNOSIS — K449 Diaphragmatic hernia without obstruction or gangrene: Secondary | ICD-10-CM | POA: Insufficient documentation

## 2017-03-02 DIAGNOSIS — K2960 Other gastritis without bleeding: Secondary | ICD-10-CM | POA: Insufficient documentation

## 2017-03-02 DIAGNOSIS — Z800 Family history of malignant neoplasm of digestive organs: Secondary | ICD-10-CM | POA: Insufficient documentation

## 2017-03-02 DIAGNOSIS — K222 Esophageal obstruction: Secondary | ICD-10-CM | POA: Insufficient documentation

## 2017-03-02 DIAGNOSIS — Z79899 Other long term (current) drug therapy: Secondary | ICD-10-CM | POA: Insufficient documentation

## 2017-03-02 DIAGNOSIS — E785 Hyperlipidemia, unspecified: Secondary | ICD-10-CM | POA: Insufficient documentation

## 2017-03-02 DIAGNOSIS — Z7982 Long term (current) use of aspirin: Secondary | ICD-10-CM | POA: Insufficient documentation

## 2017-03-02 DIAGNOSIS — F17210 Nicotine dependence, cigarettes, uncomplicated: Secondary | ICD-10-CM | POA: Insufficient documentation

## 2017-03-02 DIAGNOSIS — K2970 Gastritis, unspecified, without bleeding: Secondary | ICD-10-CM

## 2017-03-02 DIAGNOSIS — Z882 Allergy status to sulfonamides status: Secondary | ICD-10-CM | POA: Insufficient documentation

## 2017-03-02 DIAGNOSIS — K219 Gastro-esophageal reflux disease without esophagitis: Secondary | ICD-10-CM | POA: Insufficient documentation

## 2017-03-02 DIAGNOSIS — R1310 Dysphagia, unspecified: Secondary | ICD-10-CM | POA: Insufficient documentation

## 2017-03-02 DIAGNOSIS — I10 Essential (primary) hypertension: Secondary | ICD-10-CM | POA: Insufficient documentation

## 2017-03-02 HISTORY — PX: ESOPHAGOGASTRODUODENOSCOPY: SHX5428

## 2017-03-02 HISTORY — PX: SAVORY DILATION: SHX5439

## 2017-03-02 LAB — GLUCOSE, CAPILLARY: GLUCOSE-CAPILLARY: 87 mg/dL (ref 65–99)

## 2017-03-02 SURGERY — EGD (ESOPHAGOGASTRODUODENOSCOPY)
Anesthesia: Moderate Sedation

## 2017-03-02 MED ORDER — SODIUM CHLORIDE 0.9% FLUSH
INTRAVENOUS | Status: AC
  Administered 2017-03-02: 10 mL
  Filled 2017-03-02: qty 10

## 2017-03-02 MED ORDER — PROMETHAZINE HCL 25 MG/ML IJ SOLN
12.5000 mg | Freq: Once | INTRAMUSCULAR | Status: AC
  Administered 2017-03-02: 12.5 mg via INTRAVENOUS

## 2017-03-02 MED ORDER — MIDAZOLAM HCL 5 MG/5ML IJ SOLN
INTRAMUSCULAR | Status: DC | PRN
  Administered 2017-03-02 (×2): 2 mg via INTRAVENOUS

## 2017-03-02 MED ORDER — MINERAL OIL PO OIL
TOPICAL_OIL | ORAL | Status: AC
  Filled 2017-03-02: qty 30

## 2017-03-02 MED ORDER — MEPERIDINE HCL 100 MG/ML IJ SOLN
INTRAMUSCULAR | Status: AC
  Filled 2017-03-02: qty 2

## 2017-03-02 MED ORDER — SODIUM CHLORIDE 0.9 % IV SOLN
INTRAVENOUS | Status: DC
  Administered 2017-03-02: 11:00:00 via INTRAVENOUS

## 2017-03-02 MED ORDER — STERILE WATER FOR IRRIGATION IR SOLN
Status: DC | PRN
  Administered 2017-03-02: 13:00:00

## 2017-03-02 MED ORDER — DEXTROSE-NACL 5-0.9 % IV SOLN
INTRAVENOUS | Status: DC
  Administered 2017-03-02: 12:00:00 via INTRAVENOUS

## 2017-03-02 MED ORDER — LIDOCAINE VISCOUS 2 % MT SOLN
OROMUCOSAL | Status: DC | PRN
  Administered 2017-03-02: 1 via OROMUCOSAL

## 2017-03-02 MED ORDER — MEPERIDINE HCL 100 MG/ML IJ SOLN
INTRAMUSCULAR | Status: DC | PRN
  Administered 2017-03-02: 50 mg
  Administered 2017-03-02: 25 mg

## 2017-03-02 MED ORDER — PROMETHAZINE HCL 25 MG/ML IJ SOLN
INTRAMUSCULAR | Status: AC
  Filled 2017-03-02: qty 1

## 2017-03-02 MED ORDER — MIDAZOLAM HCL 5 MG/5ML IJ SOLN
INTRAMUSCULAR | Status: AC
  Filled 2017-03-02: qty 10

## 2017-03-02 MED ORDER — LIDOCAINE VISCOUS 2 % MT SOLN
OROMUCOSAL | Status: AC
  Filled 2017-03-02: qty 15

## 2017-03-02 NOTE — Op Note (Signed)
[facility] Patient Name: Alyssa [name] Procedure Date: 03/02/2017 12:03 PM MRN: [id number] Date of Birth: 1955/08/30 Attending MD: Jonette Eva MD, MD CSN: [id number] Age: 62 Admit Type: Outpatient Procedure:                Upper GI endoscopy WITH COLD FORCEPS                            BIOPSY/ESOPHAGEAL DILATION Indications:              Dysphagia Providers:                Jonette Eva MD, MD, Jannett Celestine, RN, Edythe Clarity,                            Technician Referring MD:              Medicines:                Meperidine 75 mg IV, Midazolam 4 mg IV,                            Promethazine 12.5 mg IV Complications:            No immediate complications. Estimated Blood Loss:     Estimated blood loss was minimal. Procedure:                Pre-Anesthesia Assessment:                           - Prior to the procedure, a History and Physical                            was performed, and patient medications and                            allergies were reviewed. The patient's tolerance of                            previous anesthesia was also reviewed. The risks                            and benefits of the procedure and the sedation                            options and risks were discussed with the patient.                            All questions were answered, and informed consent                            was obtained. Prior Anticoagulants: The patient has                            taken aspirin, last dose was 7 days prior to  procedure. ASA Grade Assessment: II - A patient                            with mild systemic disease. After reviewing the                            risks and benefits, the patient was deemed in                            satisfactory condition to undergo the procedure.                            After obtaining informed consent, the endoscope was                            passed under direct vision. Throughout the                   procedure, the patient's blood pressure, pulse, and                            oxygen saturations were monitored continuously. The                            [id number] ([id number]) scope was introduced through the                            mouth, and advanced to the second part of duodenum.                            The upper GI endoscopy was accomplished without                            difficulty. The patient tolerated the procedure                            well. Scope In: 12:37:43 PM Scope Out: 12:47:59 PM Total Procedure Duration: 0 hours 10 minutes 16 seconds  Findings:      One moderate (circumferential scarring or stenosis; an endoscope may       pass) benign-appearing, intrinsic stenosis was found. This measured 1.3       cm (inner diameter) and was traversed. A guidewire was placed and the       scope was withdrawn. Dilation was performed with a Savary dilator with       mild resistance at 14 mm, 15 mm, 16 mm and 17 mm.      A medium-sized hiatal hernia was present.      Patchy mild inflammation characterized by erosions and erythema was       found on the greater curvature of the stomach and in the gastric antrum.       Biopsies were taken with a cold forceps for Helicobacter pylori testing.      The examined duodenum was normal. Impression:               - DYSPHAGIA DUE TO PEPTIC STRICTURE.                           -  Medium-sized hiatal hernia.                           - MILD EROSIVE Gastritis. Biopsied. Moderate Sedation:      Moderate (conscious) sedation was administered by the endoscopy nurse       and supervised by the endoscopist. The following parameters were       monitored: oxygen saturation, heart rate, blood pressure, and response       to care. Total physician intraservice time was 21 minutes. Recommendation:           - Await pathology results.                           - High fiber diet.                           - Continue present  medications.                           - Await pathology results.                           - Return to my office in 3 months.                           - Patient has a contact number available for                            emergencies. The signs and symptoms of potential                            delayed complications were discussed with the                            patient. Return to normal activities tomorrow.                            Written discharge instructions were provided to the                            patient. Procedure Code(s):        --- Professional ---                           [id number], Esophagogastroduodenoscopy, flexible,                            transoral; with insertion of guide wire followed by                            passage of dilator(s) through esophagus over guide                            wire                           43239, Esophagogastroduodenoscopy, flexible,  transoral; with biopsy, single or multiple                           99152, Moderate sedation services provided by the                            same physician or other qualified health care                            professional performing the diagnostic or                            therapeutic service that the sedation supports,                            requiring the presence of an independent trained                            observer to assist in the monitoring of the                            patient's level of consciousness and physiological                            status; initial 15 minutes of intraservice time,                            patient age 72 years or older Diagnosis Code(s):        --- Professional ---                           K22.2, Esophageal obstruction                           K44.9, Diaphragmatic hernia without obstruction or                            gangrene                           K29.70, Gastritis, unspecified, without  bleeding                           R13.10, Dysphagia, unspecified CPT copyright 2016 American Medical Association. All rights reserved. The codes documented in this report are preliminary and upon coder review may  be revised to meet current compliance requirements. Jonette Eva, MD Jonette Eva MD, MD 03/02/2017 1:00:11 PM This report has been signed electronically. Number of Addenda: 0

## 2017-03-02 NOTE — Interval H&P Note (Signed)
History and Physical Interval Note:  03/02/2017 12:22 PM  Alyssa [name]  has presented today for surgery, with the diagnosis of dysphagia  The various methods of treatment have been discussed with the patient and family. After consideration of risks, benefits and other options for treatment, the patient has consented to  Procedure(s) with comments: ESOPHAGOGASTRODUODENOSCOPY (EGD) (N/A) - 1:00pm SAVORY DILATION (N/A) as a surgical intervention .  The patient's history has been reviewed, patient examined, no change in status, stable for surgery.  I have reviewed the patient's chart and labs.  Questions were answered to the patient's satisfaction.     [employer]i Addalee Kavanagh

## 2017-03-02 NOTE — Discharge Instructions (Signed)
I dilated your esophagus DUE TO A STRICTURE IN YOUR ESOPHAGUS FROM ACID REFLUX. You have A MODERATE SIZE HIATAL HERNIA, AND gastritis FROM ASPIRIN USE. I biopsied your stomach.    DRINK WATER TO KEEP YOUR URINE LIGHT YELLOW.  CONTINUE YOUR WEIGHT LOSS EFFORTS.  WHILE I DO NOT WANT TO ALARM YOU, YOUR BODY MASS INDEX IS OVER 30 WHICH MEANS YOU ARE OBESE. OBESITY IS ASSOCIATED WITH AN INCREASED RISK FOR ALL CANCERS, INCLUDING ESOPHAGEAL AND COLON CANCER. A WEIGHT OF 160 LBS  WILL GET YOUR BODY MASS INDEX(BMI) UNDER 30.  IT IS IMPORTANT THAT YOU AVOID REFLUX TRIGGERS OR YOU WILL DEFINITELY NEED ANOTHER UPPER ENDOSCOPY IN THE FUTURE. SEE INFO BELOW.  CONTINUE OMEPRAZOLE 30 MINUTES PRIOR TO first MEAL.   YOUR BIOPSY RESULTS WILL BE AVAILABLE IN MY CHART AFTER MAR 5 AND MY OFFICE WILL CONTACT YOU IN 10-14 DAYS WITH YOUR RESULTS.   FOLLOW UP IN JUN 2019.  UPPER ENDOSCOPY AFTER CARE Read the instructions outlined below and refer to this sheet in the next week. These discharge instructions provide you with general information on caring for yourself after you leave the hospital. While your treatment has been planned according to the most current medical practices available, unavoidable complications occasionally occur. If you have any problems or questions after discharge, call DR. Dashel Goines, [phone].  ACTIVITY  You may resume your regular activity, but move at a slower pace for the next 24 hours.   Take frequent rest periods for the next 24 hours.   Walking will help get rid of the air and reduce the bloated feeling in your belly (abdomen).   No driving for 24 hours (because of the medicine (anesthesia) used during the test).   You may shower.   Do not sign any important legal documents or operate any machinery for 24 hours (because of the anesthesia used during the test).    NUTRITION  Drink plenty of fluids.   You may resume your normal diet as instructed by your doctor.   Begin  with a light meal and progress to your normal diet. Heavy or fried foods are harder to digest and may make you feel sick to your stomach (nauseated).   Avoid alcoholic beverages for 24 hours or as instructed.    MEDICATIONS  You may resume your normal medications.   WHAT YOU CAN EXPECT TODAY  Some feelings of bloating in the abdomen.   Passage of more gas than usual.    IF YOU HAD A BIOPSY TAKEN DURING THE UPPER ENDOSCOPY:  Eat a soft diet IF YOU HAVE NAUSEA, BLOATING, ABDOMINAL PAIN, OR VOMITING.    FINDING OUT THE RESULTS OF YOUR TEST Not all test results are available during your visit. DR. Darrick Penna WILL CALL YOU WITHIN 14 DAYS OF YOUR PROCEDUE WITH YOUR RESULTS. Do not assume everything is normal if you have not heard from DR. Katlin Ciszewski, CALL HER OFFICE AT [phone].  SEEK IMMEDIATE MEDICAL ATTENTION AND CALL THE OFFICE: [phone] IF:  You have more than a spotting of blood in your stool.   Your belly is swollen (abdominal distention).   You are nauseated or vomiting.   You have a temperature over 101F.   You have abdominal pain or discomfort that is severe or gets worse throughout the day.  Gastritis  Gastritis is an inflammation (the body's way of reacting to injury and/or infection) of the stomach. It is often caused by viral or bacterial (germ) infections. It can also be caused BY  ASPIRIN, BC/GOODY POWDER'S, (IBUPROFEN) MOTRIN, OR ALEVE (NAPROXEN), chemicals (including alcohol), SPICY FOODS, and medications. This illness may be associated with generalized malaise (feeling tired, not well), UPPER ABDOMINAL STOMACH cramps, and fever. One common bacterial cause of gastritis is an organism known as H. Pylori. This can be treated with antibiotics.   ESOPHAGEAL STRICTURE  Esophageal strictures can be caused by stomach acid backing up into the tube that carries food from the mouth down to the stomach (lower esophagus).  TREATMENT There are a number of medicines used to  treat reflux/stricture, including: Antacids.  Proton-pump inhibitors: OMEPRAZOLE  PEPCID OR ZANTAC     Lifestyle and home remedies TO CONTROL HEARTBURN/REFLUX  You may eliminate or reduce the frequency of heartburn by making the following lifestyle changes:   Control your weight. Being overweight is a major risk factor for heartburn and GERD. Excess pounds put pressure on your abdomen, pushing up your stomach and causing acid to back up into your esophagus.    Eat smaller meals. 4 TO 6 MEALS A DAY. This reduces pressure on the lower esophageal sphincter, helping to prevent the valve from opening and acid from washing back into your esophagus.    Loosen your belt. Clothes that fit tightly around your waist put pressure on your abdomen and the lower esophageal sphincter.    Eliminate heartburn triggers. Everyone has specific triggers. Common triggers such as fatty or fried foods, spicy food, tomato sauce, carbonated beverages, alcohol, chocolate, mint, garlic, onion, caffeine and nicotine may make heartburn worse.    Avoid stooping or bending. Tying your shoes is OK. Bending over for longer periods to weed your garden isn't, especially soon after eating.    Don't lie down after a meal. Wait at least three to four hours after eating before going to bed, and don't lie down right after eating.    Alternative medicine  Several home remedies exist for treating GERD, but they provide only temporary relief. They include drinking baking soda (sodium bicarbonate) added to water or drinking other fluids such as baking soda mixed with cream of tartar and water.   Although these liquids create temporary relief by neutralizing, washing away or buffering acids, eventually they aggravate the situation by adding gas and fluid to your stomach, increasing pressure and causing more acid reflux. Further, adding more sodium to your diet may increase your blood pressure and add stress to your heart,  and excessive bicarbonate ingestion can alter the acid-base balance in your body.

## 2017-03-05 ENCOUNTER — Telehealth: Payer: Self-pay | Admitting: Gastroenterology

## 2017-03-05 MED ORDER — AMOXICILLIN 500 MG PO TABS: ORAL_TABLET | ORAL | 0 refills | Status: DC

## 2017-03-05 MED ORDER — CLARITHROMYCIN 500 MG PO TABS: ORAL_TABLET | ORAL | 0 refills | Status: DC

## 2017-03-05 NOTE — Telephone Encounter (Signed)
LMOM to call.

## 2017-03-05 NOTE — Progress Notes (Signed)
Called patient for follow up from procedure on Friday.  Patient states she is having trouble swallowing Prilosec capsule.  States this is one of the reasons she had procedure done.  [facility] told her previously to open the capsule and take it.  Patient states she takes the Prilosec at night.  Advised her to call Dr Evelina DunField's office and discuss with them about her problem swallowing and when she should be taking the Prilosec and whether it is ok to open the capsule to take.  Patient stated she would call Dr Evelina DunField's office.  I gave patient Dr. Evelina DunField's office number

## 2017-03-05 NOTE — Telephone Encounter (Signed)
PLEASE CALL PT. Her stomach Bx showed H. Pylori infection. She needs AMOXICILLIN 500 mg 2 po BID for 10 days and Biaxin 500 mg po bid for 10 days. She needs OMEPRAZOLE BID for 10 days then 1 po DAILY. Med side effects include NAUSEA, VOMITING, DIARRHEA, ABDOMINAL pain, and metallic taste.  DO NOT TAKE BIAXIN AND LIPITOR TOGETHER.  DRINK WATER TO KEEP YOUR URINE LIGHT YELLOW. CONTINUE YOUR WEIGHT LOSS EFFORTS.  A WEIGHT OF 160 LBS  WILL GET YOUR BODY MASS INDEX(BMI) UNDER 30.  IT IS IMPORTANT THAT YOU AVOID REFLUX TRIGGERS OR YOU WILL DEFINITELY NEED ANOTHER UPPER ENDOSCOPY IN THE FUTURE.  FOLLOW UP IN JUN 2019 E30 DYSPHAGIA/H PYLORI GASTRITIS.

## 2017-03-06 ENCOUNTER — Telehealth: Payer: Self-pay

## 2017-03-06 ENCOUNTER — Encounter (HOSPITAL_COMMUNITY): Payer: Self-pay | Admitting: Gastroenterology

## 2017-03-06 NOTE — Telephone Encounter (Signed)
Reminder in epic °

## 2017-03-06 NOTE — Telephone Encounter (Signed)
LMOM for a return call on home number. The mobile number was no longer her number so I removed it. I called the emergency contact, her daughter and that number is not working. I am sending a letter for the pt to call.

## 2017-03-06 NOTE — Telephone Encounter (Signed)
Pt came by the office, she has been having trouble with her phone. She is aware of results from procedure. She will pick up the antibiotics and I printed out the instructions for her per her request. She said she cannot swallow the Omeprazole capsule. [facility] and [facility] said she can open up the capsule and put the beads in applesauce and swallow. Just make sure not to try to chew the beads. Pt is aware.

## 2017-03-07 NOTE — Telephone Encounter (Signed)
REVIEWED-NO ADDITIONAL RECOMMENDATIONS. 

## 2017-03-13 ENCOUNTER — Telehealth: Payer: Self-pay

## 2017-03-13 DIAGNOSIS — R1310 Dysphagia, unspecified: Secondary | ICD-10-CM

## 2017-03-13 NOTE — Telephone Encounter (Signed)
Pt came by the office and said she has been having difficulty swallowing the Amoxicillin and the Biaxin. She wanted to know if she could get the remainder in liquid form.  She had been doing fairly well with pills prior to that. But sometimes when she has to take a lot of pills she has a little difficulty and then she gets a little nauseated.   The patient is having phone problems so while she was here I called the pharmacist and he said the Biaxin was a large pill and pt could cut it into. The Amoxicillin was a capsule and he said it was OK to open it up and put in applesauce to take.  Pt is aware and will finish those meds.  Dr. Darrick PennaFields, pt wants to know if there is anything she needs to do about her swallowing.

## 2017-03-16 NOTE — Telephone Encounter (Signed)
PLEASE CALL PT. If she would like liquid Abx she should let us know ho many more days she has left of the abx pills. SHE JUST HAD AN EGD AND HER ESOPHAGUS HAS BEEN STRETCHED. [facility]  [facility]YSPHAGIA.

## 2017-03-16 NOTE — Telephone Encounter (Signed)
LMOM to call.

## 2017-03-19 NOTE — Telephone Encounter (Signed)
Pt got the medication completed by cutting the pill and taking in applesauce. [facility]

## 2017-03-19 NOTE — Addendum Note (Signed)
Addended by: Tommie SamsSILVA, MINDY S on: 03/19/2017 04:12 PM   Modules accepted: Orders

## 2017-03-19 NOTE — Telephone Encounter (Signed)
LMOM to call. Mailing a letter to call also.  

## 2017-03-19 NOTE — Telephone Encounter (Addendum)
Called [facility] and was advised since patient does not have any insurance she will have to go through financial department before an appointment can be schedule. She will have to be cleared. The financial office will call the patient. Patient is aware.

## 2017-03-22 ENCOUNTER — Ambulatory Visit: Payer: Self-pay | Admitting: Physician Assistant

## 2017-03-22 VITALS — BP 106/68 | HR 67 | Temp 98.1°F | Ht 62.0 in | Wt 204.2 lb

## 2017-03-22 DIAGNOSIS — J029 Acute pharyngitis, unspecified: Secondary | ICD-10-CM

## 2017-03-22 DIAGNOSIS — R1310 Dysphagia, unspecified: Secondary | ICD-10-CM

## 2017-03-22 NOTE — Patient Instructions (Signed)
Financial office at [facility] [phone]

## 2017-03-22 NOTE — Progress Notes (Signed)
BP 106/68   Pulse 67   Temp 98.1 F (36.7 C)   Ht 5\' 2"  (1.575 m)   Wt 204 lb 4 oz (92.6 kg)   SpO2 97%   BMI 37.36 kg/m    Subjective:    Patient ID: Alyssa [name], female    DOB: 07/19/1955, 62 y.o.   MRN: [id number]  HPI: Alyssa [name] is a [age] y.o. female presenting on 03/22/2017 for Sore Throat   HPI   Pt says dr Darrick PennaFields wants her to go to tertiary care center for second opinion on why she still can't swallow  Pt says "it looks funny" on the R side- it looks like she has blisters.   No change in throat pain or swallowing issues.  Pt is still smoking  Relevant past medical, surgical, family and social history reviewed and updated as indicated. Interim medical history since our last visit reviewed. Allergies and medications reviewed and updated.   Current Outpatient Medications:  .  amoxicillin (AMOXIL) 500 MG tablet, 2 PO BID FOR 10 DAYS, Disp: 40 tablet, Rfl: 0 .  aspirin 81 MG tablet, Take 81 mg by mouth daily., Disp: , Rfl:  .  atorvastatin (LIPITOR) 40 MG tablet, Take 1 tablet (40 mg total) by mouth daily., Disp: 90 tablet, Rfl: 2 .  clarithromycin (BIAXIN) 500 MG tablet, 1 PO BID FOR 10 DAYS., Disp: 20 tablet, Rfl: 0 .  levothyroxine (SYNTHROID, LEVOTHROID) 125 MCG tablet, Take 125 mcg by mouth daily before breakfast., Disp: , Rfl:  .  lisinopril-hydrochlorothiazide (PRINZIDE,ZESTORETIC) 20-12.5 MG tablet, Take 1 tablet by mouth daily., Disp: 90 tablet, Rfl: 2 .  omeprazole (PRILOSEC) 40 MG capsule, Take 1 capsule (40 mg total) by mouth daily., Disp: 90 capsule, Rfl: 2   Review of Systems  Constitutional: Positive for fatigue. Negative for appetite change, chills, diaphoresis, fever and unexpected weight change.  HENT: Positive for dental problem, sore throat and trouble swallowing. Negative for congestion, drooling, ear pain, facial swelling, hearing loss, mouth sores, sneezing and voice change.   Eyes: Positive for visual disturbance. Negative for pain, discharge,  redness and itching.  Respiratory: Negative for cough, choking, shortness of breath and wheezing.   Cardiovascular: Negative for chest pain, palpitations and leg swelling.  Gastrointestinal: Negative for abdominal pain, blood in stool, constipation, diarrhea and vomiting.  Endocrine: Negative for cold intolerance, heat intolerance and polydipsia.  Genitourinary: Negative for decreased urine volume, dysuria and hematuria.  Musculoskeletal: Negative for arthralgias, back pain and gait problem.  Skin: Negative for rash.  Allergic/Immunologic: Negative for environmental allergies.  Neurological: Negative for seizures, syncope, light-headedness and headaches.  Hematological: Negative for adenopathy.  Psychiatric/Behavioral: Negative for agitation, dysphoric mood and suicidal ideas. The patient is not nervous/anxious.     Per HPI unless specifically indicated above     Objective:    BP 106/68   Pulse 67   Temp 98.1 F (36.7 C)   Ht 5\' 2"  (1.575 m)   Wt 204 lb 4 oz (92.6 kg)   SpO2 97%   BMI 37.36 kg/m   Wt Readings from Last 3 Encounters:  03/22/17 204 lb 4 oz (92.6 kg)  03/02/17 200 lb (90.7 kg)  02/01/17 200 lb 3.2 oz (90.8 kg)    Physical Exam  Constitutional: She is oriented to person, place, and time. She appears well-developed and well-nourished.  HENT:  Head: Normocephalic and atraumatic.  Right Ear: Hearing, tympanic membrane, external ear and ear canal normal.  Left Ear: Hearing, tympanic  membrane, external ear and ear canal normal.  Nose: Nose normal.  Mouth/Throat: Uvula is midline and oropharynx is clear and moist. No oral lesions. No trismus in the jaw. No uvula swelling. No oropharyngeal exudate.  No oral blisters seen  Neck: Neck supple.  Cardiovascular: Normal rate and regular rhythm.  Pulmonary/Chest: Effort normal and breath sounds normal. She has no wheezes.  Lymphadenopathy:    She has no cervical adenopathy.  Neurological: She is alert and oriented to  person, place, and time.  Skin: Skin is warm and dry.  Psychiatric: She has a normal mood and affect. Her behavior is normal.  Vitals reviewed.       Assessment & Plan:    Encounter Diagnoses  Name Primary?  . Pill dysphagia Yes  . Sore throat     -Warm salt water gargles.  -Gave pt contact phone number for financial office at [facility] -counseled smoking cessation -pt to follow up as scheduled.  RTO sooner prn

## 2017-03-29 ENCOUNTER — Other Ambulatory Visit: Payer: Self-pay | Admitting: Physician Assistant

## 2017-03-29 MED ORDER — OMEPRAZOLE 40 MG PO CPDR: 40.0000 mg | DELAYED_RELEASE_CAPSULE | Freq: Every day | ORAL | 1 refills | Status: DC

## 2017-04-05 ENCOUNTER — Other Ambulatory Visit (HOSPITAL_COMMUNITY)
Admission: RE | Admit: 2017-04-05 | Discharge: 2017-04-05 | Disposition: A | Discharge status: Home / Self Care | Payer: Self-pay | Source: Ambulatory Visit | Attending: Physician Assistant | Admitting: Physician Assistant

## 2017-04-05 DIAGNOSIS — I10 Essential (primary) hypertension: Secondary | ICD-10-CM | POA: Insufficient documentation

## 2017-04-05 DIAGNOSIS — E785 Hyperlipidemia, unspecified: Secondary | ICD-10-CM | POA: Insufficient documentation

## 2017-04-05 DIAGNOSIS — E039 Hypothyroidism, unspecified: Secondary | ICD-10-CM | POA: Insufficient documentation

## 2017-04-05 LAB — COMPREHENSIVE METABOLIC PANEL
ALT: 17 U/L (ref 14–54)
AST: 21 U/L (ref 15–41)
Albumin: 4.3 g/dL (ref 3.5–5.0)
Alkaline Phosphatase: 90 U/L (ref 38–126)
Anion gap: 10 (ref 5–15)
BILIRUBIN TOTAL: 0.7 mg/dL (ref 0.3–1.2)
BUN: 14 mg/dL (ref 6–20)
CO2: 22 mmol/L (ref 22–32)
CREATININE: 0.76 mg/dL (ref 0.44–1.00)
Calcium: 9.3 mg/dL (ref 8.9–10.3)
Chloride: 103 mmol/L (ref 101–111)
GFR calc Af Amer: >60 mL/min (ref >60)
Glucose, Bld: 110 mg/dL — ABNORMAL HIGH (ref 65–99)
Potassium: 3.6 mmol/L (ref 3.5–5.1)
Sodium: 135 mmol/L (ref 135–145)
TOTAL PROTEIN: 8 g/dL (ref 6.5–8.1)

## 2017-04-05 LAB — LIPID PANEL
CHOLESTEROL: 271 mg/dL — AB (ref 0–200)
HDL: 44 mg/dL (ref >40)
LDL Cholesterol: 199 mg/dL — ABNORMAL HIGH (ref 0–99)
TRIGLYCERIDES: 138 mg/dL (ref <150)
Total CHOL/HDL Ratio: 6.2 RATIO
VLDL: 28 mg/dL (ref 0–40)

## 2017-04-05 LAB — TSH: TSH: 2.368 u[IU]/mL (ref 0.350–4.500)

## 2017-04-12 ENCOUNTER — Encounter: Payer: Self-pay | Admitting: Physician Assistant

## 2017-04-12 ENCOUNTER — Ambulatory Visit: Payer: Self-pay | Admitting: Physician Assistant

## 2017-04-12 VITALS — BP 116/80 | HR 77 | Temp 98.1°F | Ht 62.0 in | Wt 200.8 lb

## 2017-04-12 DIAGNOSIS — F17210 Nicotine dependence, cigarettes, uncomplicated: Secondary | ICD-10-CM

## 2017-04-12 DIAGNOSIS — I10 Essential (primary) hypertension: Secondary | ICD-10-CM

## 2017-04-12 DIAGNOSIS — E785 Hyperlipidemia, unspecified: Secondary | ICD-10-CM

## 2017-04-12 DIAGNOSIS — K219 Gastro-esophageal reflux disease without esophagitis: Secondary | ICD-10-CM

## 2017-04-12 DIAGNOSIS — E669 Obesity, unspecified: Secondary | ICD-10-CM

## 2017-04-12 DIAGNOSIS — E039 Hypothyroidism, unspecified: Secondary | ICD-10-CM

## 2017-04-12 NOTE — Telephone Encounter (Signed)
Called [facility] to f/u on referral. Financial counseling tried to call pt 03/27/17 and were unable to leave VM. She can call financial counseling [phone]. I tried to call pt, no answer, LMOVM and gave [facility] number to [facility].

## 2017-04-12 NOTE — Patient Instructions (Addendum)

## 2017-04-12 NOTE — Progress Notes (Signed)
BP 116/80 (BP Location: Left Arm, Patient Position: Sitting, Cuff Size: Normal)   Pulse 77   Temp 98.1 F (36.7 C)   Ht 5\' 2"  (1.575 m)   Wt 200 lb 12 oz (91.1 kg)   SpO2 97%   BMI 36.72 kg/m    Subjective:    Patient ID: Alyssa [name], female    DOB: 08-15-1955, 62 y.o.   MRN: [id number]  HPI: TRESEA HEINE is a 62 y.o. female presenting on 04/12/2017 for Hyperlipidemia; Thyroid Problem; Gastroesophageal Reflux; and Hypertension   HPI  Pt says she has contacted financial office at [facility]  She has not heard an answer on that yet  Pt says her swallowing is better  She is still smoking  Relevant past medical, surgical, family and social history reviewed and updated as indicated. Interim medical history since our last visit reviewed. Allergies and medications reviewed and updated.   Current Outpatient Medications:  .  aspirin 81 MG tablet, Take 81 mg by mouth daily., Disp: , Rfl:  .  atorvastatin (LIPITOR) 40 MG tablet, Take 1 tablet (40 mg total) by mouth daily., Disp: 90 tablet, Rfl: 2 .  levothyroxine (SYNTHROID, LEVOTHROID) 125 MCG tablet, Take 125 mcg by mouth daily before breakfast., Disp: , Rfl:  .  lisinopril-hydrochlorothiazide (PRINZIDE,ZESTORETIC) 20-12.5 MG tablet, Take 1 tablet by mouth daily., Disp: 90 tablet, Rfl: 2 .  omeprazole (PRILOSEC) 40 MG capsule, Take 1 capsule (40 mg total) by mouth daily., Disp: 90 capsule, Rfl: 1   Review of Systems  Constitutional: Positive for fatigue. Negative for appetite change, chills, diaphoresis, fever and unexpected weight change.  HENT: Positive for dental problem. Negative for congestion, drooling, ear pain, facial swelling, hearing loss, mouth sores, sneezing, sore throat, trouble swallowing and voice change.   Eyes: Positive for visual disturbance. Negative for pain, discharge, redness and itching.  Respiratory: Negative for cough, choking, shortness of breath and wheezing.   Cardiovascular: Negative for chest pain,  palpitations and leg swelling.  Gastrointestinal: Negative for abdominal pain, blood in stool, constipation, diarrhea and vomiting.  Endocrine: Negative for cold intolerance, heat intolerance and polydipsia.  Genitourinary: Negative for decreased urine volume, dysuria and hematuria.  Musculoskeletal: Negative for arthralgias, back pain and gait problem.  Skin: Negative for rash.  Allergic/Immunologic: Negative for environmental allergies.  Neurological: Negative for seizures, syncope, light-headedness and headaches.  Hematological: Negative for adenopathy.  Psychiatric/Behavioral: Negative for agitation, dysphoric mood and suicidal ideas. The patient is not nervous/anxious.     Per HPI unless specifically indicated above     Objective:    BP 116/80 (BP Location: Left Arm, Patient Position: Sitting, Cuff Size: Normal)   Pulse 77   Temp 98.1 F (36.7 C)   Ht 5\' 2"  (1.575 m)   Wt 200 lb 12 oz (91.1 kg)   SpO2 97%   BMI 36.72 kg/m   Wt Readings from Last 3 Encounters:  04/12/17 200 lb 12 oz (91.1 kg)  03/22/17 204 lb 4 oz (92.6 kg)  03/02/17 200 lb (90.7 kg)    Physical Exam  Constitutional: She is oriented to person, place, and time. She appears well-developed and well-nourished.  HENT:  Head: Normocephalic and atraumatic.  Neck: Neck supple.  Cardiovascular: Normal rate and regular rhythm.  Pulmonary/Chest: Effort normal and breath sounds normal.  Abdominal: Soft. Bowel sounds are normal. She exhibits no mass. There is no hepatosplenomegaly. There is no tenderness.  Musculoskeletal: She exhibits no edema.  Lymphadenopathy:    She has  no cervical adenopathy.  Neurological: She is alert and oriented to person, place, and time.  Skin: Skin is warm and dry.  Psychiatric: She has a normal mood and affect. Her behavior is normal.  Vitals reviewed.   Results for orders placed or performed during the hospital encounter of 04/05/17  TSH  Result Value Ref Range   TSH 2.368  0.350 - 4.500 uIU/mL  Lipid panel  Result Value Ref Range   Cholesterol 271 (H) 0 - 200 mg/dL   Triglycerides 161138 <096<150 mg/dL   HDL 44 >04>40 mg/dL   Total CHOL/HDL Ratio 6.2 RATIO   VLDL 28 0 - 40 mg/dL   LDL Cholesterol 540199 (H) 0 - 99 mg/dL  Comprehensive metabolic panel  Result Value Ref Range   Sodium 135 135 - 145 mmol/L   Potassium 3.6 3.5 - 5.1 mmol/L   Chloride 103 101 - 111 mmol/L   CO2 22 22 - 32 mmol/L   Glucose, Bld 110 (H) 65 - 99 mg/dL   BUN 14 6 - 20 mg/dL   Creatinine, Ser 9.810.76 0.44 - 1.00 mg/dL   Calcium 9.3 8.9 - 19.110.3 mg/dL   Total Protein 8.0 6.5 - 8.1 g/dL   Albumin 4.3 3.5 - 5.0 g/dL   AST 21 15 - 41 U/L   ALT 17 14 - 54 U/L   Alkaline Phosphatase 90 38 - 126 U/L   Total Bilirubin 0.7 0.3 - 1.2 mg/dL   GFR calc non Af Amer >60 >60 mL/min   GFR calc Af Amer >60 >60 mL/min   Anion gap 10 5 - 15      Assessment & Plan:   Encounter Diagnoses  Name Primary?  . Hyperlipidemia, unspecified hyperlipidemia type Yes  . Essential hypertension, benign   . Hypothyroidism, unspecified type   . Cigarette nicotine dependence, uncomplicated   . Gastroesophageal reflux disease, esophagitis presence not specified   . Obesity, unspecified classification, unspecified obesity type, unspecified whether serious comorbidity present     -reviewed labs with pt -counseled pt about increase in lipids, likely related to her eating at work a lot (she works at [employer]).  Discussed increasing the lipitor but pt wants to work on diet -will refer for screening mammogram -pt to follow up in 3 months.  Will update PAP at that appointment

## 2017-04-18 ENCOUNTER — Other Ambulatory Visit: Payer: Self-pay | Admitting: Physician Assistant

## 2017-04-18 DIAGNOSIS — Z1231 Encounter for screening mammogram for malignant neoplasm of breast: Secondary | ICD-10-CM

## 2017-04-21 IMAGING — DX DG CHEST 2V
2 series · 2 of 2 positions shown · non-contrast
Comparison: Chest x-ray dated 12/11/2013.

CLINICAL DATA: Lower back pain with increased urinary frequency,
painful urination and hematuria for 7 days, body aches.

EXAM:
CHEST  2 VIEW

[chest pa]
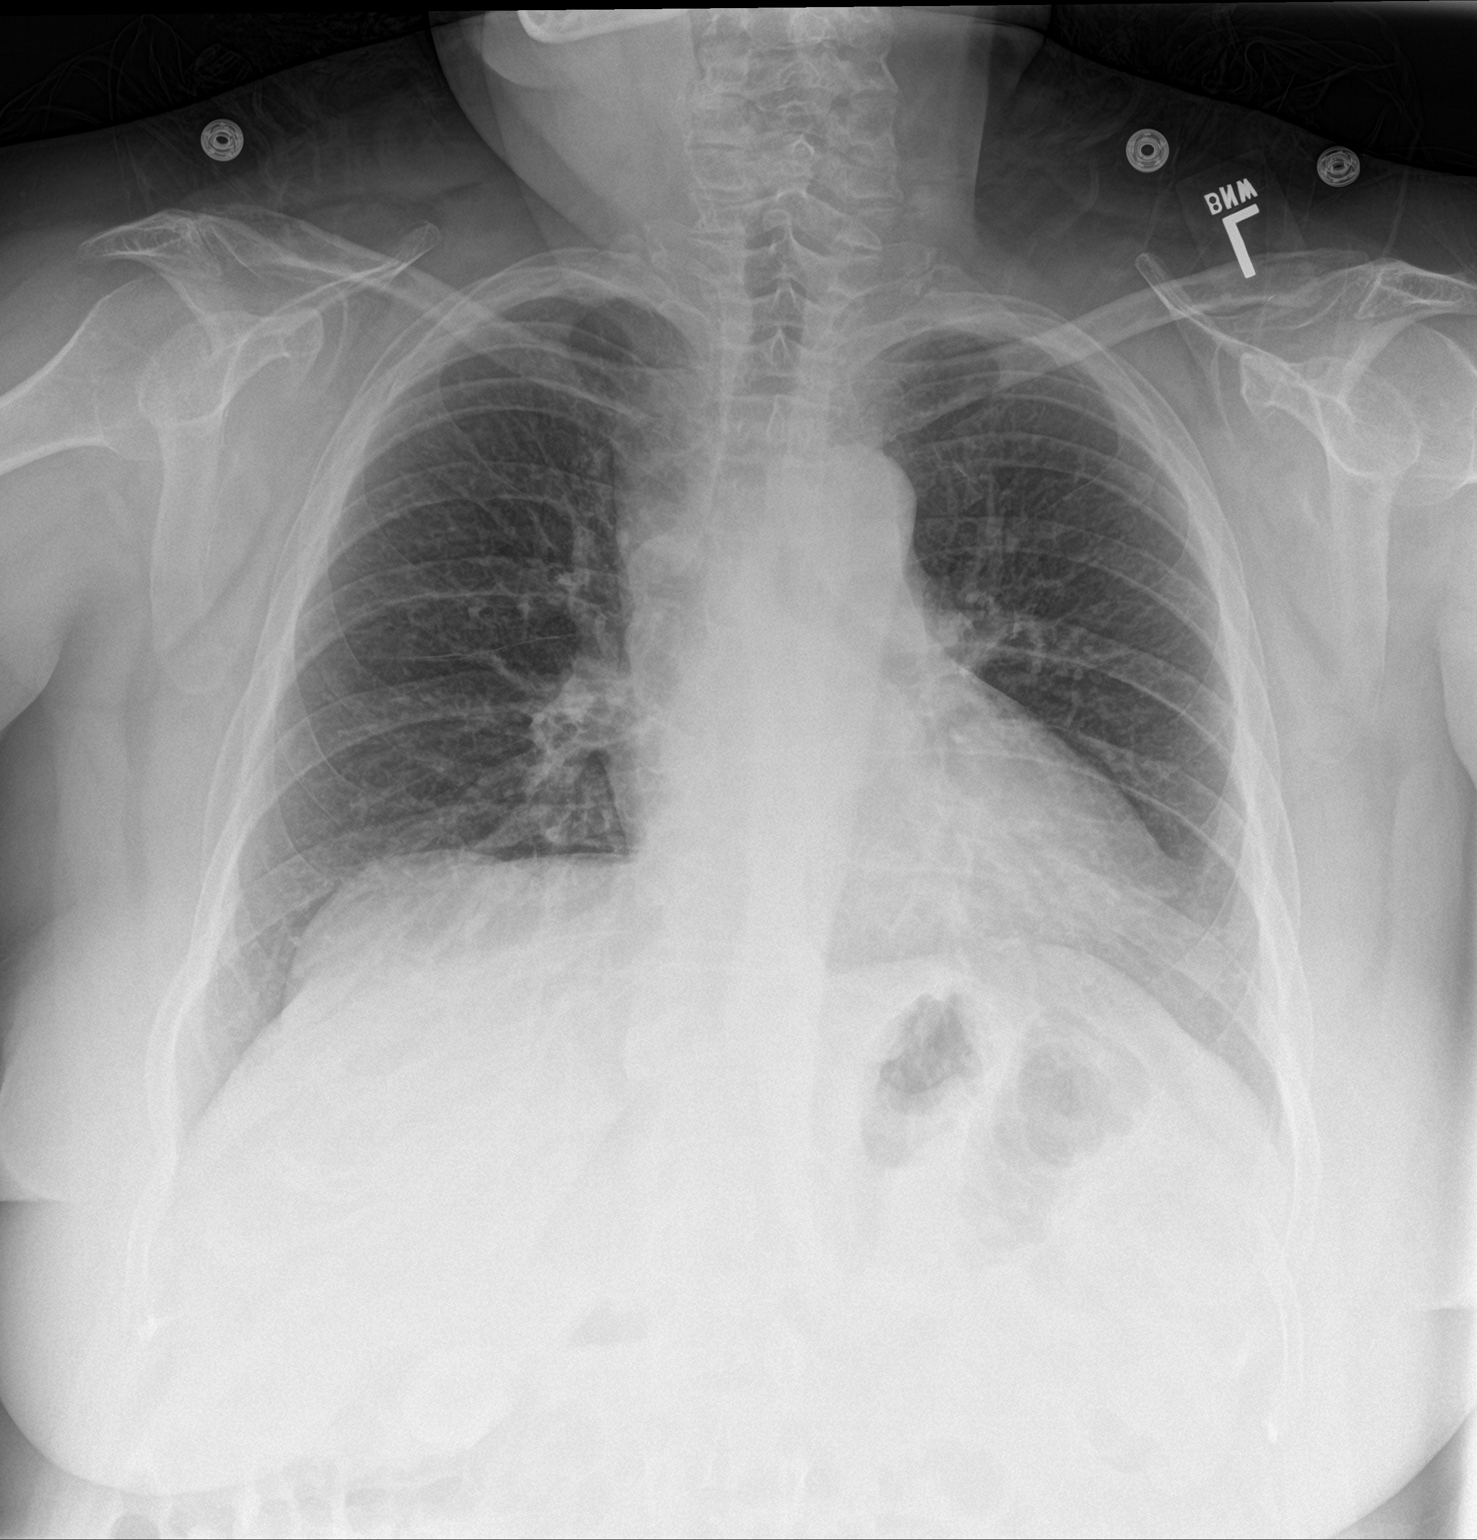

[chest lat]
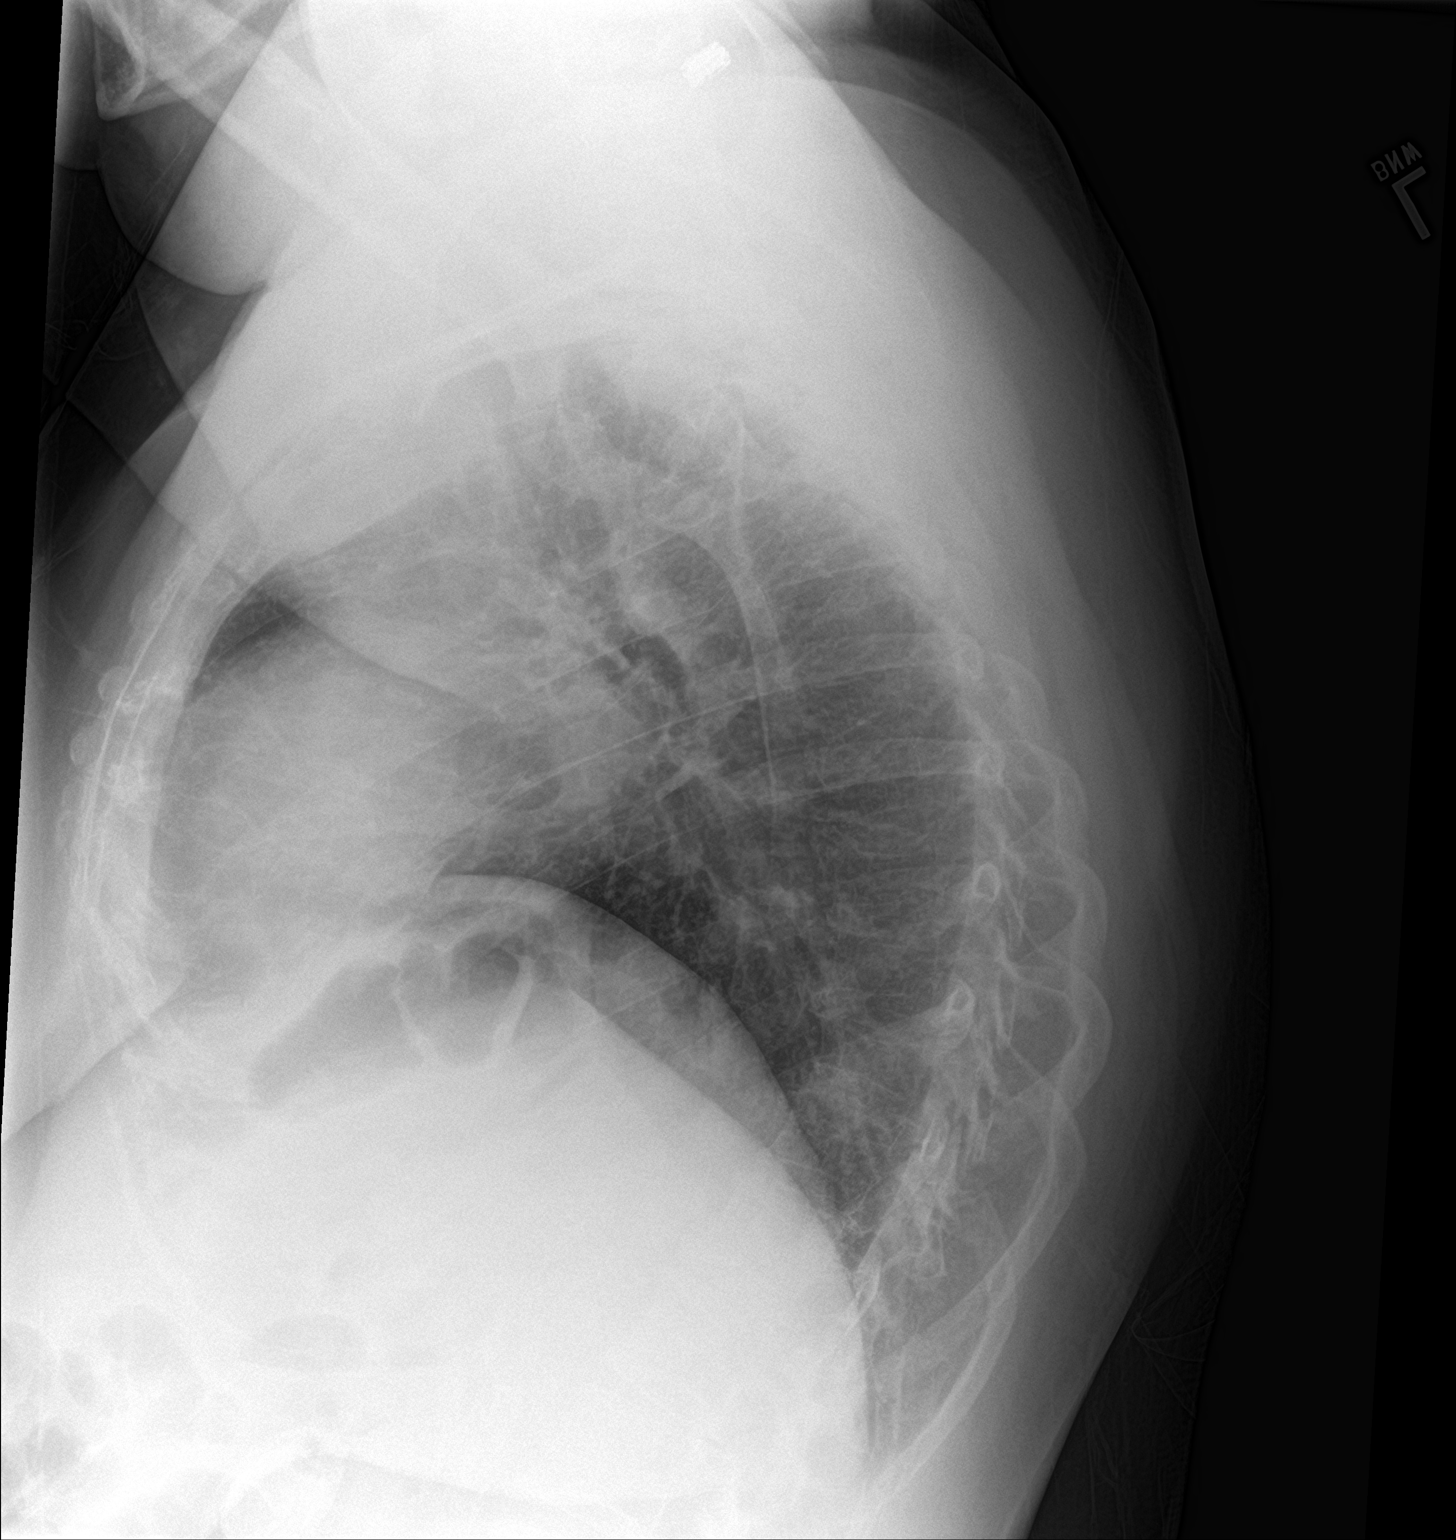

[2 of 2 positions shown; findings below may reference images not displayed]

FINDINGS: Cardiomediastinal silhouette is stable in size and configuration.
Lungs are clear. No evidence of pneumonia. No pleural effusion.

There is kyphosis of the thoracic spine, slightly more prominent
than on the previous exam. Thoracic vertebral bodies are difficult
to visualize due to osteopenia.
IMPRESSION: 1. Lungs are clear and there is no evidence of acute cardiopulmonary
abnormality.
2. Thoracic spine cannot be definitively characterized due to the
osteopenia. There is slightly increased kyphosis compared to the
previous exam. Although this may be positional in nature, I cannot
exclude a new compression fracture deformity causing the slight
increase in kyphosis. If any midline back pain, recommend chest CT
for further characterization.

## 2017-04-26 ENCOUNTER — Encounter: Payer: Self-pay | Admitting: Gastroenterology

## 2017-05-07 ENCOUNTER — Telehealth: Payer: Self-pay | Admitting: *Deleted

## 2017-05-07 NOTE — Telephone Encounter (Signed)
Called [facility] to check on status of referral. Patient has not been cleared by financial clearance since she has no insurance. She was contacted on 03/27/17, no VM. They did not receive a call back from patient and her case was closed. I asked them to re-open her case and attempt to call her again. They will mail her a form she needs to fill out and fax to them.FYI to Dr. Darrick Penna.

## 2017-05-07 NOTE — Telephone Encounter (Signed)
REVIEWED. AGREE. NO ADDITIONAL RECOMMENDATIONS. 

## 2017-05-30 NOTE — Telephone Encounter (Signed)
Called [facility], pt hasn't been cleared by financial counseling.

## 2017-06-07 NOTE — Telephone Encounter (Signed)
[facility] and patient still not cleared through financial counseling yet.

## 2017-06-13 NOTE — Telephone Encounter (Signed)
[facility], pt was mailed form to complete for financial counseling. She hasn't returned form. Letter mailed to pt to contact financial counseling if she would like to proceed with referral.

## 2017-06-14 NOTE — Telephone Encounter (Signed)
REVIEWED-NO ADDITIONAL RECOMMENDATIONS. 

## 2017-07-09 ENCOUNTER — Other Ambulatory Visit (HOSPITAL_COMMUNITY)
Admission: RE | Admit: 2017-07-09 | Discharge: 2017-07-09 | Disposition: A | Discharge status: Home / Self Care | Payer: Self-pay | Source: Ambulatory Visit | Attending: Physician Assistant | Admitting: Physician Assistant

## 2017-07-09 DIAGNOSIS — E039 Hypothyroidism, unspecified: Secondary | ICD-10-CM

## 2017-07-09 DIAGNOSIS — I10 Essential (primary) hypertension: Secondary | ICD-10-CM

## 2017-07-09 DIAGNOSIS — E785 Hyperlipidemia, unspecified: Secondary | ICD-10-CM

## 2017-07-09 LAB — COMPREHENSIVE METABOLIC PANEL
ALK PHOS: 75 U/L (ref 38–126)
ALT: 18 U/L (ref 0–44)
AST: 18 U/L (ref 15–41)
Albumin: 3.8 g/dL (ref 3.5–5.0)
Anion gap: 8 (ref 5–15)
BUN: 14 mg/dL (ref 8–23)
CALCIUM: 9.2 mg/dL (ref 8.9–10.3)
CO2: 22 mmol/L (ref 22–32)
CREATININE: 0.84 mg/dL (ref 0.44–1.00)
Chloride: 107 mmol/L (ref 98–111)
GFR calc Af Amer: >60 mL/min (ref >60)
Glucose, Bld: 122 mg/dL — ABNORMAL HIGH (ref 70–99)
Potassium: 3.4 mmol/L — ABNORMAL LOW (ref 3.5–5.1)
Sodium: 137 mmol/L (ref 135–145)
Total Bilirubin: 0.3 mg/dL (ref 0.3–1.2)
Total Protein: 7.2 g/dL (ref 6.5–8.1)

## 2017-07-09 LAB — LIPID PANEL
Cholesterol: 246 mg/dL — ABNORMAL HIGH (ref 0–200)
HDL: 33 mg/dL — AB (ref >40)
LDL Cholesterol: 161 mg/dL — ABNORMAL HIGH (ref 0–99)
TRIGLYCERIDES: 260 mg/dL — AB (ref <150)
Total CHOL/HDL Ratio: 7.5 RATIO
VLDL: 52 mg/dL — ABNORMAL HIGH (ref 0–40)

## 2017-07-09 LAB — TSH: TSH: 4.208 u[IU]/mL (ref 0.350–4.500)

## 2017-07-12 ENCOUNTER — Encounter: Payer: Self-pay | Admitting: Physician Assistant

## 2017-07-12 ENCOUNTER — Ambulatory Visit: Payer: Self-pay | Admitting: Physician Assistant

## 2017-07-12 ENCOUNTER — Other Ambulatory Visit (HOSPITAL_COMMUNITY)
Admission: RE | Admit: 2017-07-12 | Discharge: 2017-07-12 | Disposition: A | Discharge status: Home / Self Care | Payer: Self-pay | Source: Ambulatory Visit | Attending: Physician Assistant | Admitting: Physician Assistant

## 2017-07-12 VITALS — BP 122/71 | HR 64 | Temp 97.9°F | Ht 62.0 in | Wt 207.0 lb

## 2017-07-12 DIAGNOSIS — E669 Obesity, unspecified: Secondary | ICD-10-CM

## 2017-07-12 DIAGNOSIS — E785 Hyperlipidemia, unspecified: Secondary | ICD-10-CM

## 2017-07-12 DIAGNOSIS — F17210 Nicotine dependence, cigarettes, uncomplicated: Secondary | ICD-10-CM

## 2017-07-12 DIAGNOSIS — I10 Essential (primary) hypertension: Secondary | ICD-10-CM

## 2017-07-12 DIAGNOSIS — E039 Hypothyroidism, unspecified: Secondary | ICD-10-CM

## 2017-07-12 DIAGNOSIS — Z124 Encounter for screening for malignant neoplasm of cervix: Secondary | ICD-10-CM

## 2017-07-12 DIAGNOSIS — Z1239 Encounter for other screening for malignant neoplasm of breast: Secondary | ICD-10-CM

## 2017-07-12 DIAGNOSIS — Z87898 Personal history of other specified conditions: Secondary | ICD-10-CM

## 2017-07-12 DIAGNOSIS — K219 Gastro-esophageal reflux disease without esophagitis: Secondary | ICD-10-CM

## 2017-07-12 MED ORDER — OMEPRAZOLE 40 MG PO CPDR: 40.0000 mg | DELAYED_RELEASE_CAPSULE | Freq: Every day | ORAL | 1 refills | Status: DC

## 2017-07-12 MED ORDER — ATORVASTATIN CALCIUM 80 MG PO TABS: 80.0000 mg | ORAL_TABLET | Freq: Every day | ORAL | 1 refills | Status: DC

## 2017-07-12 MED ORDER — LISINOPRIL-HYDROCHLOROTHIAZIDE 20-12.5 MG PO TABS: 1.0000 | ORAL_TABLET | Freq: Every day | ORAL | 2 refills | Status: DC

## 2017-07-12 MED ORDER — LEVOTHYROXINE SODIUM 112 MCG PO TABS: 112.0000 ug | ORAL_TABLET | Freq: Every day | ORAL | 1 refills | Status: DC

## 2017-07-12 NOTE — Progress Notes (Signed)
BP 122/71 (BP Location: Right Arm, Patient Position: Sitting, Cuff Size: Large)   Pulse 64   Temp 97.9 F (36.6 C) (Other (Comment))   Ht 5\' 2"  (1.575 m)   Wt 207 lb (93.9 kg)   LMP 07/16/2000 (Approximate)   SpO2 97%   BMI 37.86 kg/m    Subjective:    Patient ID: Alyssa [name], female    DOB: 26-Dec-1955, 62 y.o.   MRN: [id number]  HPI: Alyssa [name] is a 62 y.o. female presenting on 07/12/2017 for Hyperlipidemia; Hypertension; Hypothyroidism; and Gynecologic Exam   HPI   Chief Complaint  Patient presents with  . Hyperlipidemia  . Hypertension  . Hypothyroidism  . Gynecologic Exam     Pt still working at [employer].  Still smoking some.   She says she is doing well and she has no complaints.   Relevant past medical, surgical, family and social history reviewed and updated as indicated. Interim medical history since our last visit reviewed. Allergies and medications reviewed and updated.   Current Outpatient Medications:  .  aspirin 81 MG tablet, Take 81 mg by mouth daily., Disp: , Rfl:  .  atorvastatin (LIPITOR) 40 MG tablet, Take 1 tablet (40 mg total) by mouth daily., Disp: 90 tablet, Rfl: 2 .  levothyroxine (SYNTHROID, LEVOTHROID) 125 MCG tablet, Take 125 mcg by mouth daily before breakfast., Disp: , Rfl:  .  lisinopril-hydrochlorothiazide (PRINZIDE,ZESTORETIC) 20-12.5 MG tablet, Take 1 tablet by mouth daily., Disp: 90 tablet, Rfl: 2 .  omeprazole (PRILOSEC) 40 MG capsule, Take 1 capsule (40 mg total) by mouth daily., Disp: 90 capsule, Rfl: 1   Review of Systems  Constitutional: Negative for appetite change, chills, diaphoresis, fatigue, fever and unexpected weight change.  HENT: Negative for congestion, dental problem, drooling, ear pain, facial swelling, hearing loss, mouth sores, sneezing, sore throat, trouble swallowing and voice change.   Eyes: Negative for pain, discharge, redness, itching and visual disturbance.  Respiratory: Negative for cough, choking,  shortness of breath and wheezing.   Cardiovascular: Negative for chest pain, palpitations and leg swelling.  Gastrointestinal: Negative for abdominal pain, blood in stool, constipation, diarrhea and vomiting.  Endocrine: Negative for cold intolerance, heat intolerance and polydipsia.  Genitourinary: Negative for decreased urine volume, dysuria and hematuria.  Musculoskeletal: Negative for arthralgias, back pain and gait problem.  Skin: Negative for rash.  Allergic/Immunologic: Negative for environmental allergies.  Neurological: Negative for seizures, syncope, light-headedness and headaches.  Hematological: Negative for adenopathy.  Psychiatric/Behavioral: Negative for agitation, dysphoric mood and suicidal ideas. The patient is not nervous/anxious.     Per HPI unless specifically indicated above     Objective:    BP 122/71 (BP Location: Right Arm, Patient Position: Sitting, Cuff Size: Large)   Pulse 64   Temp 97.9 F (36.6 C) (Other (Comment))   Ht 5\' 2"  (1.575 m)   Wt 207 lb (93.9 kg)   LMP 07/16/2000 (Approximate)   SpO2 97%   BMI 37.86 kg/m   Wt Readings from Last 3 Encounters:  07/12/17 207 lb (93.9 kg)  04/12/17 200 lb 12 oz (91.1 kg)  03/22/17 204 lb 4 oz (92.6 kg)    Physical Exam  Constitutional: She is oriented to person, place, and time. She appears well-developed and well-nourished.  HENT:  Head: Normocephalic and atraumatic.  Neck: Neck supple.  Cardiovascular: Normal rate and regular rhythm.  Pulmonary/Chest: Effort normal and breath sounds normal. No breast tenderness, discharge or bleeding.  Breast exam normal  Abdominal: Soft.  Bowel sounds are normal. She exhibits no mass. There is no hepatosplenomegaly. There is no tenderness. There is no rebound and no guarding.  Genitourinary: Vagina normal and uterus normal. No breast tenderness, discharge or bleeding. There is no rash, tenderness or lesion on the right labia. There is no rash, tenderness or lesion on  the left labia. Cervix exhibits no motion tenderness, no discharge and no friability. Right adnexum displays no mass, no tenderness and no fullness. Left adnexum displays no mass, no tenderness and no fullness.  Genitourinary Comments: Difficult to visualize cervix due to redundant tissue. (nurse Berenice assisted)   Musculoskeletal: She exhibits no edema.  Lymphadenopathy:    She has no cervical adenopathy.  Neurological: She is alert and oriented to person, place, and time.  Skin: Skin is warm and dry.  Psychiatric: She has a normal mood and affect. Her behavior is normal.  Nursing note and vitals reviewed.   Results for orders placed or performed during the hospital encounter of 07/09/17  TSH  Result Value Ref Range   TSH 4.208 0.350 - 4.500 uIU/mL  Lipid panel  Result Value Ref Range   Cholesterol 246 (H) 0 - 200 mg/dL   Triglycerides 161260 (H) <150 mg/dL   HDL 33 (L) >09>40 mg/dL   Total CHOL/HDL Ratio 7.5 RATIO   VLDL 52 (H) 0 - 40 mg/dL   LDL Cholesterol 604161 (H) 0 - 99 mg/dL  Comprehensive metabolic panel  Result Value Ref Range   Sodium 137 135 - 145 mmol/L   Potassium 3.4 (L) 3.5 - 5.1 mmol/L   Chloride 107 98 - 111 mmol/L   CO2 22 22 - 32 mmol/L   Glucose, Bld 122 (H) 70 - 99 mg/dL   BUN 14 8 - 23 mg/dL   Creatinine, Ser 5.400.84 0.44 - 1.00 mg/dL   Calcium 9.2 8.9 - 98.110.3 mg/dL   Total Protein 7.2 6.5 - 8.1 g/dL   Albumin 3.8 3.5 - 5.0 g/dL   AST 18 15 - 41 U/L   ALT 18 0 - 44 U/L   Alkaline Phosphatase 75 38 - 126 U/L   Total Bilirubin 0.3 0.3 - 1.2 mg/dL   GFR calc non Af Amer >60 >60 mL/min   GFR calc Af Amer >60 >60 mL/min   Anion gap 8 5 - 15      Assessment & Plan:   Encounter Diagnoses  Name Primary?  . Routine Papanicolaou smear Yes  . Hyperlipidemia, unspecified hyperlipidemia type   . Essential hypertension, benign   . Hypothyroidism, unspecified type   . Cigarette nicotine dependence, uncomplicated   . Gastroesophageal reflux disease, esophagitis  presence not specified   . Obesity, unspecified classification, unspecified obesity type, unspecified whether serious comorbidity present   . Screening for breast cancer     -Reviewed labs with pt -will Increase lipitor.  Counseled pt to watch lowfat diet -Ordered screening mammogram- due to lack of funding from other sources, pt is counseled to call [facility] (she is given the phone number) and tell them that [facility] and that she needs a screening mammogram.  Pt states understanding -pt counseled on BSE -counseled smoking cessation -Pt to follow up 3 months.  RTO sooner prn

## 2017-07-12 NOTE — Patient Instructions (Addendum)
[facility] (for screening mammogram) [phone]

## 2017-10-09 ENCOUNTER — Other Ambulatory Visit (HOSPITAL_COMMUNITY)
Admission: RE | Admit: 2017-10-09 | Discharge: 2017-10-09 | Disposition: A | Discharge status: Home / Self Care | Payer: Self-pay | Source: Ambulatory Visit | Attending: Physician Assistant | Admitting: Physician Assistant

## 2017-10-09 DIAGNOSIS — E785 Hyperlipidemia, unspecified: Secondary | ICD-10-CM | POA: Insufficient documentation

## 2017-10-09 DIAGNOSIS — I10 Essential (primary) hypertension: Secondary | ICD-10-CM | POA: Insufficient documentation

## 2017-10-09 DIAGNOSIS — Z87898 Personal history of other specified conditions: Secondary | ICD-10-CM | POA: Insufficient documentation

## 2017-10-09 LAB — LIPID PANEL
CHOLESTEROL: 241 mg/dL — AB (ref 0–200)
HDL: 32 mg/dL — ABNORMAL LOW (ref >40)
LDL CALC: 176 mg/dL — AB (ref 0–99)
TRIGLYCERIDES: 165 mg/dL — AB (ref <150)
Total CHOL/HDL Ratio: 7.5 RATIO
VLDL: 33 mg/dL (ref 0–40)

## 2017-10-09 LAB — COMPREHENSIVE METABOLIC PANEL
ALK PHOS: 69 U/L (ref 38–126)
ALT: 21 U/L (ref 0–44)
ANION GAP: 7 (ref 5–15)
AST: 20 U/L (ref 15–41)
Albumin: 4.3 g/dL (ref 3.5–5.0)
BILIRUBIN TOTAL: 0.5 mg/dL (ref 0.3–1.2)
BUN: 16 mg/dL (ref 8–23)
CALCIUM: 9.1 mg/dL (ref 8.9–10.3)
CO2: 25 mmol/L (ref 22–32)
Chloride: 106 mmol/L (ref 98–111)
Creatinine, Ser: 0.76 mg/dL (ref 0.44–1.00)
GFR calc non Af Amer: >60 mL/min (ref >60)
Glucose, Bld: 95 mg/dL (ref 70–99)
Potassium: 3.8 mmol/L (ref 3.5–5.1)
SODIUM: 138 mmol/L (ref 135–145)
TOTAL PROTEIN: 7.7 g/dL (ref 6.5–8.1)

## 2017-10-09 LAB — HEMOGLOBIN A1C
Hgb A1c MFr Bld: 6.4 % — ABNORMAL HIGH (ref 4.8–5.6)
MEAN PLASMA GLUCOSE: 136.98 mg/dL

## 2017-10-11 ENCOUNTER — Ambulatory Visit: Payer: Self-pay | Admitting: Physician Assistant

## 2017-10-11 ENCOUNTER — Encounter: Payer: Self-pay | Admitting: Physician Assistant

## 2017-10-11 VITALS — BP 125/79 | HR 82 | Temp 98.1°F | Wt 216.8 lb

## 2017-10-11 DIAGNOSIS — E669 Obesity, unspecified: Secondary | ICD-10-CM

## 2017-10-11 DIAGNOSIS — K219 Gastro-esophageal reflux disease without esophagitis: Secondary | ICD-10-CM

## 2017-10-11 DIAGNOSIS — Z1239 Encounter for other screening for malignant neoplasm of breast: Secondary | ICD-10-CM

## 2017-10-11 DIAGNOSIS — E785 Hyperlipidemia, unspecified: Secondary | ICD-10-CM

## 2017-10-11 DIAGNOSIS — I10 Essential (primary) hypertension: Secondary | ICD-10-CM

## 2017-10-11 DIAGNOSIS — E039 Hypothyroidism, unspecified: Secondary | ICD-10-CM

## 2017-10-11 DIAGNOSIS — F17200 Nicotine dependence, unspecified, uncomplicated: Secondary | ICD-10-CM

## 2017-10-11 NOTE — Patient Instructions (Signed)
Prediabetes Prediabetes is the condition of having a blood sugar (blood glucose) level that is higher than it should be, but not high enough for you to be diagnosed with type 2 diabetes. Having prediabetes puts you at risk for developing type 2 diabetes (type 2 diabetes mellitus). Prediabetes may be called impaired glucose tolerance or impaired fasting glucose. Prediabetes usually does not cause symptoms. Your health care provider can diagnose this condition with blood tests. You may be tested for prediabetes if you are overweight and if you have at least one other risk factor for prediabetes. Risk factors for prediabetes include:  Having a family member with type 2 diabetes.  Being overweight or obese.  Being older than age 45.  Being of American-Indian, African-American, Hispanic/Latino, or Asian/Pacific Islander descent.  Having an inactive (sedentary) lifestyle.  Having a history of gestational diabetes or polycystic ovarian syndrome (PCOS).  Having low levels of good cholesterol (HDL-C) or high levels of blood fats (triglycerides).  Having high blood pressure.  What is blood glucose and how is blood glucose measured?  Blood glucose refers to the amount of glucose in your bloodstream. Glucose comes from eating foods that contain sugars and starches (carbohydrates) that the body breaks down into glucose. Your blood glucose level may be measured in mg/dL (milligrams per deciliter) or mmol/L (millimoles per liter).Your blood glucose may be checked with one or more of the following blood tests:  A fasting blood glucose (FBG) test. You will not be allowed to eat (you will fast) for at least 8 hours before a blood sample is taken. ? A normal range for FBG is 70-100 mg/dl (3.9-5.6 mmol/L).  An A1c (hemoglobin A1c) blood test. This test provides information about blood glucose control over the previous 2?3months.  An oral glucose tolerance test (OGTT). This test measures your blood  glucose twice: ? After fasting. This is your baseline level. ? Two hours after you drink a beverage that contains glucose.  You may be diagnosed with prediabetes:  If your FBG is 100?125 mg/dL (5.6-6.9 mmol/L).  If your A1c level is 5.7?6.4%.  If your OGGT result is 140?199 mg/dL (7.8-11 mmol/L).  These blood tests may be repeated to confirm your diagnosis. What happens if blood glucose is too high? The pancreas produces a hormone (insulin) that helps move glucose from the bloodstream into cells. When cells in the body do not respond properly to insulin that the body makes (insulin resistance), excess glucose builds up in the blood instead of going into cells. As a result, high blood glucose (hyperglycemia) can develop, which can cause many complications. This is a symptom of prediabetes. What can happen if blood glucose stays higher than normal for a long time? Having high blood glucose for a long time is dangerous. Too much glucose in your blood can damage your nerves and blood vessels. Long-term damage can lead to complications from diabetes, which may include:  Heart disease.  Stroke.  Blindness.  Kidney disease.  Depression.  Poor circulation in the feet and legs, which could lead to surgical removal (amputation) in severe cases.  How can prediabetes be prevented from turning into type 2 diabetes?  To help prevent type 2 diabetes, take the following actions:  Be physically active. ? Do moderate-intensity physical activity for at least 30 minutes on at least 5 days of the week, or as much as told by your health care provider. This could be brisk walking, biking, or water aerobics. ? Ask your health care provider what   activities are safe for you. A mix of physical activities may be best, such as walking, swimming, cycling, and strength training.  Lose weight as told by your health care provider. ? Losing 5-7% of your body weight can reverse insulin resistance. ? Your health  care provider can determine how much weight loss is best for you and can help you lose weight safely.  Follow a healthy meal plan. This includes eating lean proteins, complex carbohydrates, fresh fruits and vegetables, low-fat dairy products, and healthy fats. ? Follow instructions from your health care provider about eating or drinking restrictions. ? Make an appointment to see a diet and nutrition specialist (registered dietitian) to help you create a healthy eating plan that is right for you.  Do not smoke or use any tobacco products, such as cigarettes, chewing tobacco, and e-cigarettes. If you need help quitting, ask your health care provider.  Take over-the-counter and prescription medicines as told by your health care provider. You may be prescribed medicines that help lower the risk of type 2 diabetes.  This information is not intended to replace advice given to you by your health care provider. Make sure you discuss any questions you have with your health care provider. Document Released: 04/12/2015 Document Revised: 05/27/2015 Document Reviewed: 02/09/2015 Elsevier Interactive Patient Education  2018 [employer]  

## 2017-10-11 NOTE — Progress Notes (Signed)
BP 125/79 (BP Location: Right Arm, Patient Position: Sitting, Cuff Size: Normal)   Pulse 82   Temp 98.1 F (36.7 C)   Wt 216 lb 12 oz (98.3 kg)   LMP 07/16/2000 (Approximate)   SpO2 95%   BMI 39.64 kg/m    Subjective:    Patient ID: Alyssa [name], female    DOB: 27-Nov-1955, 62 y.o.   MRN: [id number]  HPI: Alyssa [name] is a 62 y.o. female presenting on 10/11/2017 for Hyperlipidemia   HPI   Pt is still working at [employer].  She is still smoking.  She has no complaints today.  Relevant past medical, surgical, family and social history reviewed and updated as indicated. Interim medical history since our last visit reviewed. Allergies and medications reviewed and updated.   Current Outpatient Medications:  .  aspirin 81 MG tablet, Take 81 mg by mouth daily., Disp: , Rfl:  .  atorvastatin (LIPITOR) 80 MG tablet, Take 1 tablet (80 mg total) by mouth daily., Disp: 90 tablet, Rfl: 1 .  levothyroxine (SYNTHROID, LEVOTHROID) 112 MCG tablet, Take 1 tablet (112 mcg total) by mouth daily., Disp: 90 tablet, Rfl: 1 .  lisinopril-hydrochlorothiazide (PRINZIDE,ZESTORETIC) 20-12.5 MG tablet, Take 1 tablet by mouth daily., Disp: 90 tablet, Rfl: 2 .  omeprazole (PRILOSEC) 40 MG capsule, Take 1 capsule (40 mg total) by mouth daily., Disp: 90 capsule, Rfl: 1   Review of Systems  Constitutional: Negative for appetite change, chills, diaphoresis, fatigue, fever and unexpected weight change.  HENT: Negative for congestion, dental problem, drooling, ear pain, facial swelling, hearing loss, mouth sores, sneezing, sore throat, trouble swallowing and voice change.   Eyes: Negative for pain, discharge, redness, itching and visual disturbance.  Respiratory: Negative for cough, choking, shortness of breath and wheezing.   Cardiovascular: Negative for chest pain, palpitations and leg swelling.  Gastrointestinal: Negative for abdominal pain, blood in stool, constipation, diarrhea and vomiting.  Endocrine:  Negative for cold intolerance, heat intolerance and polydipsia.  Genitourinary: Negative for decreased urine volume, dysuria and hematuria.  Musculoskeletal: Negative for arthralgias, back pain and gait problem.  Skin: Negative for rash.  Allergic/Immunologic: Negative for environmental allergies.  Neurological: Negative for seizures, syncope, light-headedness and headaches.  Hematological: Negative for adenopathy.  Psychiatric/Behavioral: Negative for agitation, dysphoric mood and suicidal ideas. The patient is not nervous/anxious.     Per HPI unless specifically indicated above     Objective:    BP 125/79 (BP Location: Right Arm, Patient Position: Sitting, Cuff Size: Normal)   Pulse 82   Temp 98.1 F (36.7 C)   Wt 216 lb 12 oz (98.3 kg)   LMP 07/16/2000 (Approximate)   SpO2 95%   BMI 39.64 kg/m   Wt Readings from Last 3 Encounters:  10/11/17 216 lb 12 oz (98.3 kg)  07/12/17 207 lb (93.9 kg)  04/12/17 200 lb 12 oz (91.1 kg)    Physical Exam  Constitutional: She is oriented to person, place, and time. She appears well-developed and well-nourished.  HENT:  Head: Normocephalic and atraumatic.  Neck: Neck supple.  Cardiovascular: Normal rate and regular rhythm.  Pulmonary/Chest: Effort normal and breath sounds normal. No respiratory distress.  Abdominal: Soft. Bowel sounds are normal. She exhibits no mass. There is no hepatosplenomegaly. There is no tenderness.  Musculoskeletal: She exhibits no edema.  Lymphadenopathy:    She has no cervical adenopathy.  Neurological: She is alert and oriented to person, place, and time.  Skin: Skin is warm and dry.  Psychiatric:  She has a normal mood and affect. Her behavior is normal.  Vitals reviewed.   Results for orders placed or performed during the hospital encounter of 10/09/17  Comprehensive metabolic panel  Result Value Ref Range   Sodium 138 135 - 145 mmol/L   Potassium 3.8 3.5 - 5.1 mmol/L   Chloride 106 98 - 111 mmol/L    CO2 25 22 - 32 mmol/L   Glucose, Bld 95 70 - 99 mg/dL   BUN 16 8 - 23 mg/dL   Creatinine, Ser 3.08 0.44 - 1.00 mg/dL   Calcium 9.1 8.9 - 65.7 mg/dL   Total Protein 7.7 6.5 - 8.1 g/dL   Albumin 4.3 3.5 - 5.0 g/dL   AST 20 15 - 41 U/L   ALT 21 0 - 44 U/L   Alkaline Phosphatase 69 38 - 126 U/L   Total Bilirubin 0.5 0.3 - 1.2 mg/dL   GFR calc non Af Amer >60 >60 mL/min   GFR calc Af Amer >60 >60 mL/min   Anion gap 7 5 - 15  Hemoglobin A1c  Result Value Ref Range   Hgb A1c MFr Bld 6.4 (H) 4.8 - 5.6 %   Mean Plasma Glucose 136.98 mg/dL  Lipid panel  Result Value Ref Range   Cholesterol 241 (H) 0 - 200 mg/dL   Triglycerides 846 (H) <150 mg/dL   HDL 32 (L) >96 mg/dL   Total CHOL/HDL Ratio 7.5 RATIO   VLDL 33 0 - 40 mg/dL   LDL Cholesterol 295 (H) 0 - 99 mg/dL      Assessment & Plan:   Encounter Diagnoses  Name Primary?  . Essential hypertension, benign Yes  . Hyperlipidemia, unspecified hyperlipidemia type   . Hypothyroidism, unspecified type   . Tobacco use disorder   . Obesity, unspecified classification, unspecified obesity type, unspecified whether serious comorbidity present   . Gastroesophageal reflux disease, esophagitis presence not specified   . Screening for breast cancer     -reviewed labs with pt.  Counseled pt again on lowfat diet and regular exercise for the lipids -counseled smoking cessation -pt to continue current medications -ordered screening mammogram -pt to follow up 3 months.  RTO sooner prn

## 2017-10-30 ENCOUNTER — Telehealth (HOSPITAL_COMMUNITY): Payer: Self-pay | Admitting: Obstetrics and Gynecology

## 2017-10-30 NOTE — Telephone Encounter (Signed)
Called patient and left message on voicemail regarding scheduling a screening mammogram.

## 2017-11-02 ENCOUNTER — Telehealth (HOSPITAL_COMMUNITY): Payer: Self-pay

## 2017-11-02 NOTE — Telephone Encounter (Signed)
Left a message to call [facility] °

## 2018-01-07 ENCOUNTER — Other Ambulatory Visit (HOSPITAL_COMMUNITY)
Admission: RE | Admit: 2018-01-07 | Discharge: 2018-01-07 | Disposition: A | Discharge status: Home / Self Care | Payer: Self-pay | Source: Ambulatory Visit | Attending: Physician Assistant | Admitting: Physician Assistant

## 2018-01-07 DIAGNOSIS — E785 Hyperlipidemia, unspecified: Secondary | ICD-10-CM | POA: Insufficient documentation

## 2018-01-07 DIAGNOSIS — I10 Essential (primary) hypertension: Secondary | ICD-10-CM | POA: Insufficient documentation

## 2018-01-07 LAB — COMPREHENSIVE METABOLIC PANEL
ALT: 22 U/L (ref 0–44)
ANION GAP: 6 (ref 5–15)
AST: 21 U/L (ref 15–41)
Albumin: 4.1 g/dL (ref 3.5–5.0)
Alkaline Phosphatase: 71 U/L (ref 38–126)
BILIRUBIN TOTAL: 0.4 mg/dL (ref 0.3–1.2)
BUN: 9 mg/dL (ref 8–23)
CO2: 25 mmol/L (ref 22–32)
Calcium: 9.3 mg/dL (ref 8.9–10.3)
Chloride: 105 mmol/L (ref 98–111)
Creatinine, Ser: 0.75 mg/dL (ref 0.44–1.00)
GFR calc Af Amer: >60 mL/min (ref >60)
Glucose, Bld: 115 mg/dL — ABNORMAL HIGH (ref 70–99)
POTASSIUM: 4 mmol/L (ref 3.5–5.1)
Sodium: 136 mmol/L (ref 135–145)
TOTAL PROTEIN: 7.6 g/dL (ref 6.5–8.1)

## 2018-01-07 LAB — LIPID PANEL
Cholesterol: 261 mg/dL — ABNORMAL HIGH (ref 0–200)
HDL: 35 mg/dL — ABNORMAL LOW (ref >40)
LDL CALC: 179 mg/dL — AB (ref 0–99)
TRIGLYCERIDES: 237 mg/dL — AB (ref <150)
Total CHOL/HDL Ratio: 7.5 RATIO
VLDL: 47 mg/dL — AB (ref 0–40)

## 2018-01-10 ENCOUNTER — Other Ambulatory Visit: Payer: Self-pay | Admitting: Physician Assistant

## 2018-01-10 ENCOUNTER — Encounter: Payer: Self-pay | Admitting: Physician Assistant

## 2018-01-10 ENCOUNTER — Ambulatory Visit: Payer: Self-pay | Admitting: Physician Assistant

## 2018-01-10 VITALS — BP 116/80 | HR 94 | Temp 97.9°F | Ht 62.0 in | Wt 219.5 lb

## 2018-01-10 DIAGNOSIS — E039 Hypothyroidism, unspecified: Secondary | ICD-10-CM

## 2018-01-10 DIAGNOSIS — F17200 Nicotine dependence, unspecified, uncomplicated: Secondary | ICD-10-CM

## 2018-01-10 DIAGNOSIS — Z1211 Encounter for screening for malignant neoplasm of colon: Secondary | ICD-10-CM

## 2018-01-10 DIAGNOSIS — I10 Essential (primary) hypertension: Secondary | ICD-10-CM

## 2018-01-10 DIAGNOSIS — Z1239 Encounter for other screening for malignant neoplasm of breast: Secondary | ICD-10-CM

## 2018-01-10 DIAGNOSIS — E785 Hyperlipidemia, unspecified: Secondary | ICD-10-CM

## 2018-01-10 MED ORDER — ALBUTEROL SULFATE HFA 108 (90 BASE) MCG/ACT IN AERS: 2.0000 | INHALATION_SPRAY | Freq: Four times a day (QID) | RESPIRATORY_TRACT | 0 refills | Status: DC | PRN

## 2018-01-10 NOTE — Progress Notes (Signed)
BP 116/80 (BP Location: Right Arm, Patient Position: Sitting, Cuff Size: Normal)   Pulse 94   Temp 97.9 F (36.6 C) (Other (Comment))   Ht 5\' 2"  (1.575 m)   Wt 219 lb 8 oz (99.6 kg)   LMP 07/16/2000 (Approximate)   SpO2 98%   BMI 40.15 kg/m    Subjective:    Patient ID: Alyssa [name], female    DOB: 01/06/1955, 63 y.o.   MRN: [id number]  HPI: Alyssa [name] is a 63 y.o. female presenting on 01/10/2018 for Hyperlipidemia (etc)   HPI   Pt is doing well.  She is still working at [employer].  She is still smoking.  Relevant past medical, surgical, family and social history reviewed and updated as indicated. Interim medical history since our last visit reviewed. Allergies and medications reviewed and updated.   Current Outpatient Medications:  .  aspirin 81 MG tablet, Take 81 mg by mouth daily., Disp: , Rfl:  .  atorvastatin (LIPITOR) 80 MG tablet, Take 1 tablet (80 mg total) by mouth daily., Disp: 90 tablet, Rfl: 1 .  levothyroxine (SYNTHROID, LEVOTHROID) 112 MCG tablet, Take 1 tablet (112 mcg total) by mouth daily., Disp: 90 tablet, Rfl: 1 .  lisinopril-hydrochlorothiazide (PRINZIDE,ZESTORETIC) 20-12.5 MG tablet, Take 1 tablet by mouth daily., Disp: 90 tablet, Rfl: 2 .  omeprazole (PRILOSEC) 40 MG capsule, Take 1 capsule (40 mg total) by mouth daily., Disp: 90 capsule, Rfl: 1   Review of Systems  Constitutional: Negative for appetite change, chills, diaphoresis, fatigue, fever and unexpected weight change.  HENT: Negative for congestion, dental problem, drooling, ear pain, facial swelling, hearing loss, mouth sores, sneezing, sore throat, trouble swallowing and voice change.   Eyes: Negative for pain, discharge, redness, itching and visual disturbance.  Respiratory: Negative for cough, choking, shortness of breath and wheezing.   Cardiovascular: Negative for chest pain, palpitations and leg swelling.  Gastrointestinal: Negative for abdominal pain, blood in stool, constipation,  diarrhea and vomiting.  Endocrine: Negative for cold intolerance, heat intolerance and polydipsia.  Genitourinary: Negative for decreased urine volume, dysuria and hematuria.  Musculoskeletal: Negative for arthralgias, back pain and gait problem.  Skin: Negative for rash.  Allergic/Immunologic: Negative for environmental allergies.  Neurological: Negative for seizures, syncope, light-headedness and headaches.  Hematological: Negative for adenopathy.  Psychiatric/Behavioral: Negative for agitation, dysphoric mood and suicidal ideas. The patient is not nervous/anxious.     Per HPI unless specifically indicated above     Objective:    BP 116/80 (BP Location: Right Arm, Patient Position: Sitting, Cuff Size: Normal)   Pulse 94   Temp 97.9 F (36.6 C) (Other (Comment))   Ht 5\' 2"  (1.575 m)   Wt 219 lb 8 oz (99.6 kg)   LMP 07/16/2000 (Approximate)   SpO2 98%   BMI 40.15 kg/m   Wt Readings from Last 3 Encounters:  01/10/18 219 lb 8 oz (99.6 kg)  10/11/17 216 lb 12 oz (98.3 kg)  07/12/17 207 lb (93.9 kg)    Physical Exam Vitals signs reviewed.  Constitutional:      Appearance: She is well-developed.  HENT:     Head: Normocephalic and atraumatic.  Neck:     Musculoskeletal: Neck supple.  Cardiovascular:     Rate and Rhythm: Normal rate and regular rhythm.  Pulmonary:     Effort: Pulmonary effort is normal.     Breath sounds: Normal breath sounds.  Abdominal:     General: Bowel sounds are normal.  Palpations: Abdomen is soft. There is no mass.     Tenderness: There is no abdominal tenderness.  Lymphadenopathy:     Cervical: No cervical adenopathy.  Skin:    General: Skin is warm and dry.  Neurological:     Mental Status: She is alert and oriented to person, place, and time.  Psychiatric:        Behavior: Behavior normal.     Results for orders placed or performed during the hospital encounter of 01/07/18  Lipid panel  Result Value Ref Range   Cholesterol 261 (H)  0 - 200 mg/dL   Triglycerides 672 (H) <150 mg/dL   HDL 35 (L) >09 mg/dL   Total CHOL/HDL Ratio 7.5 RATIO   VLDL 47 (H) 0 - 40 mg/dL   LDL Cholesterol 470 (H) 0 - 99 mg/dL  Comprehensive metabolic panel  Result Value Ref Range   Sodium 136 135 - 145 mmol/L   Potassium 4.0 3.5 - 5.1 mmol/L   Chloride 105 98 - 111 mmol/L   CO2 25 22 - 32 mmol/L   Glucose, Bld 115 (H) 70 - 99 mg/dL   BUN 9 8 - 23 mg/dL   Creatinine, Ser 9.62 0.44 - 1.00 mg/dL   Calcium 9.3 8.9 - 83.6 mg/dL   Total Protein 7.6 6.5 - 8.1 g/dL   Albumin 4.1 3.5 - 5.0 g/dL   AST 21 15 - 41 U/L   ALT 22 0 - 44 U/L   Alkaline Phosphatase 71 38 - 126 U/L   Total Bilirubin 0.4 0.3 - 1.2 mg/dL   GFR calc non Af Amer >60 >60 mL/min   GFR calc Af Amer >60 >60 mL/min   Anion gap 6 5 - 15      Assessment & Plan:   Encounter Diagnoses  Name Primary?  . Essential hypertension, benign Yes  . Hyperlipidemia, unspecified hyperlipidemia type   . Hypothyroidism, unspecified type   . Tobacco use disorder   . Morbid obesity (HCC)   . Screening for breast cancer   . Special screening for malignant neoplasms, colon     -Reviewed labs with pt -Check into sending pt to specialist for [facility] -Ordered screening mammogram -pt was given iFOBT for colon cancer screening -counseled smoking cessation -rx albuterol inhaler to help with episodes of sob -pt to follow up 3 months.  RTO sooner prn

## 2018-02-07 ENCOUNTER — Other Ambulatory Visit: Payer: Self-pay | Admitting: Physician Assistant

## 2018-02-11 ENCOUNTER — Telehealth: Payer: Self-pay | Admitting: *Deleted

## 2018-02-11 NOTE — Telephone Encounter (Signed)
Telephoned patient left a message to return a call to [facility], regarding a mammo appointment.

## 2018-02-13 ENCOUNTER — Telehealth: Payer: Self-pay | Admitting: *Deleted

## 2018-02-13 NOTE — Telephone Encounter (Signed)
Telephoned patient, left a message to return a call to [facility] regarding a mammo appointment. 

## 2018-03-04 ENCOUNTER — Other Ambulatory Visit: Payer: Self-pay | Admitting: Physician Assistant

## 2018-03-04 DIAGNOSIS — Z1239 Encounter for other screening for malignant neoplasm of breast: Secondary | ICD-10-CM

## 2018-03-14 ENCOUNTER — Encounter (HOSPITAL_COMMUNITY): Payer: Self-pay

## 2018-04-18 ENCOUNTER — Ambulatory Visit: Payer: Self-pay | Admitting: Physician Assistant

## 2018-04-18 DIAGNOSIS — I10 Essential (primary) hypertension: Secondary | ICD-10-CM

## 2018-04-18 DIAGNOSIS — K219 Gastro-esophageal reflux disease without esophagitis: Secondary | ICD-10-CM

## 2018-04-18 DIAGNOSIS — F17200 Nicotine dependence, unspecified, uncomplicated: Secondary | ICD-10-CM

## 2018-04-18 DIAGNOSIS — E039 Hypothyroidism, unspecified: Secondary | ICD-10-CM

## 2018-04-18 DIAGNOSIS — E785 Hyperlipidemia, unspecified: Secondary | ICD-10-CM

## 2018-04-18 DIAGNOSIS — E669 Obesity, unspecified: Secondary | ICD-10-CM

## 2018-04-18 NOTE — Progress Notes (Signed)
   LMP 07/16/2000 (Approximate)    Subjective:    Patient ID: Alyssa [name], female    DOB: Oct 24, 1955, 63 y.o.   MRN: [id number]  HPI: Alyssa [name] is a 63 y.o. female presenting on 04/18/2018 for No chief complaint on file.   HPI   This is a telemedicine appointment through [employer] due to coronavirus pandemic  I connected with  Alyssa [name] on 04/18/18  by a video enabled telemedicine application and verified that I am speaking with the correct person using two identifiers.   I discussed the limitations of evaluation and management by telemedicine. The patient expressed understanding and agreed to proceed.  Pt says she is doing well and is without complaint.  She is still smoking.    Relevant past medical, surgical, family and social history reviewed and updated as indicated. Interim medical history since our last visit reviewed. Allergies and medications reviewed and updated.   Current Outpatient Medications:  .  albuterol (PROVENTIL HFA;VENTOLIN HFA) 108 (90 Base) MCG/ACT inhaler, Inhale 2 puffs into the lungs every 6 (six) hours as needed for wheezing or shortness of breath., Disp: 3 Inhaler, Rfl: 0 .  aspirin 81 MG tablet, Take 81 mg by mouth daily., Disp: , Rfl:  .  atorvastatin (LIPITOR) 80 MG tablet, TAKE 1 Tablet BY MOUTH ONCE DAILY, Disp: 90 tablet, Rfl: 1 .  lisinopril-hydrochlorothiazide (PRINZIDE,ZESTORETIC) 20-12.5 MG tablet, Take 1 tablet by mouth daily., Disp: 90 tablet, Rfl: 2 .  omeprazole (PRILOSEC) 40 MG capsule, TAKE 1 Capsule BY MOUTH ONCE DAILY, Disp: 90 capsule, Rfl: 1 .  SYNTHROID 112 MCG tablet, TAKE 1 Tablet BY MOUTH ONCE DAILY, Disp: 90 tablet, Rfl: 1    Review of Systems  Per HPI unless specifically indicated above     Objective:    LMP 07/16/2000 (Approximate)   Wt Readings from Last 3 Encounters:  01/10/18 219 lb 8 oz (99.6 kg)  10/11/17 216 lb 12 oz (98.3 kg)  07/12/17 207 lb (93.9 kg)    Physical Exam Constitutional:      General: She is  not in acute distress.    Appearance: She is not ill-appearing.  HENT:     Head: Normocephalic and atraumatic.  Pulmonary:     Effort: Pulmonary effort is normal. No respiratory distress.  Neurological:     Mental Status: She is alert and oriented to person, place, and time.  Psychiatric:        Mood and Affect: Mood normal.        Behavior: Behavior is cooperative.            Assessment & Plan:   Encounter Diagnoses  Name Primary?  . Essential hypertension, benign Yes  . Hypothyroidism, unspecified type   . Hyperlipidemia, unspecified hyperlipidemia type   . Gastroesophageal reflux disease, esophagitis presence not specified   . Obesity, unspecified classification, unspecified obesity type, unspecified whether serious comorbidity present   . Tobacco use disorder     -Pt to continue current mediction -Counseled smoking cessation -recomended pt to Wear a face covering when goes to grocery -defer routine labs at this time due to CV19 -Follow up 3 months-  Needs referral to lipid clinic

## 2018-04-28 ENCOUNTER — Encounter: Payer: Self-pay | Admitting: Physician Assistant

## 2018-06-10 ENCOUNTER — Other Ambulatory Visit: Payer: Self-pay | Admitting: Physician Assistant

## 2018-07-08 ENCOUNTER — Encounter: Payer: Self-pay | Admitting: Physician Assistant

## 2018-07-08 ENCOUNTER — Ambulatory Visit: Payer: Self-pay | Admitting: Physician Assistant

## 2018-07-08 DIAGNOSIS — I10 Essential (primary) hypertension: Secondary | ICD-10-CM

## 2018-07-08 DIAGNOSIS — K219 Gastro-esophageal reflux disease without esophagitis: Secondary | ICD-10-CM

## 2018-07-08 DIAGNOSIS — E039 Hypothyroidism, unspecified: Secondary | ICD-10-CM

## 2018-07-08 DIAGNOSIS — Z1239 Encounter for other screening for malignant neoplasm of breast: Secondary | ICD-10-CM

## 2018-07-08 DIAGNOSIS — E669 Obesity, unspecified: Secondary | ICD-10-CM

## 2018-07-08 DIAGNOSIS — E785 Hyperlipidemia, unspecified: Secondary | ICD-10-CM

## 2018-07-08 DIAGNOSIS — F17200 Nicotine dependence, unspecified, uncomplicated: Secondary | ICD-10-CM

## 2018-07-08 NOTE — Progress Notes (Signed)
LMP 07/16/2000 (Approximate)    Subjective:    Patient ID: Alyssa [name], female    DOB: 06/04/1955, 63 y.o.   MRN: [id number]  HPI: Alyssa [name] is a 63 y.o. female presenting on 07/08/2018 for No chief complaint on file.   HPI  This is a telemedicine appointemnt through [employer] due to coronavirus pandemic.  I connected with  Alyssa [name] on 07/08/18 by a video enabled telemedicine application and verified that I am speaking with the correct person using two identifiers.   I discussed the limitations of evaluation and management by telemedicine. The patient expressed understanding and agreed to proceed.  Pt is at home.  Provider is at office.      She is no longer working at [employer].  She is not working at this time.  She is using her inhaler maybe 3 day/week.   She is still smoking.    Pt has no complaints today.       Relevant past medical, surgical, family and social history reviewed and updated as indicated. Interim medical history since our last visit reviewed. Allergies and medications reviewed and updated.    Current Outpatient Medications:  .  aspirin 81 MG tablet, Take 81 mg by mouth daily., Disp: , Rfl:  .  atorvastatin (LIPITOR) 80 MG tablet, TAKE 1 Tablet BY MOUTH ONCE DAILY, Disp: 90 tablet, Rfl: 1 .  lisinopril-hydrochlorothiazide (ZESTORETIC) 20-12.5 MG tablet, TAKE 1 Tablet BY MOUTH ONCE DAILY, Disp: 90 tablet, Rfl: 2 .  omeprazole (PRILOSEC) 40 MG capsule, TAKE 1 Capsule BY MOUTH ONCE DAILY, Disp: 90 capsule, Rfl: 1 .  PROVENTIL HFA 108 (90 Base) MCG/ACT inhaler, INHALE 2 PUFFS BY MOUTH EVERY 6 HOURS AS NEEDED FOR COUGHING, WHEEZING, OR SHORTNESS OF BREATH, Disp: 20.1 g, Rfl: 0 .  SYNTHROID 112 MCG tablet, TAKE 1 Tablet BY MOUTH ONCE DAILY, Disp: 90 tablet, Rfl: 1   Review of Systems  Per HPI unless specifically indicated above     Objective:    LMP 07/16/2000 (Approximate)   Wt Readings from Last 3 Encounters:  01/10/18 219 lb 8 oz (99.6 kg)   10/11/17 216 lb 12 oz (98.3 kg)  07/12/17 207 lb (93.9 kg)    Physical Exam Constitutional:      General: She is not in acute distress.    Appearance: Normal appearance. She is obese. She is not ill-appearing.  HENT:     Head: Normocephalic and atraumatic.  Pulmonary:     Effort: Pulmonary effort is normal. No respiratory distress.  Neurological:     Mental Status: She is alert and oriented to person, place, and time.  Psychiatric:        Attention and Perception: Attention normal.        Mood and Affect: Mood normal.        Speech: Speech normal.        Behavior: Behavior is cooperative.         Assessment & Plan:    Encounter Diagnoses  Name Primary?  . Essential hypertension, benign Yes  . Hypothyroidism, unspecified type   . Hyperlipidemia, unspecified hyperlipidemia type   . Tobacco use disorder   . Obesity, unspecified classification, unspecified obesity type, unspecified whether serious comorbidity present   . Gastroesophageal reflux disease, esophagitis presence not specified   . Screening for breast cancer     -pt to Get fasting labs drawn.  She will be called with results -will refer for screening Mammogram -pt to Continue current  medications -encouraged smoking cessation -pt encouraged to wear a mask when she goes out in public to reduce risk of covid 19 -pt to follow up 3 months.  She is to contact office sooner prn

## 2018-07-11 ENCOUNTER — Other Ambulatory Visit (HOSPITAL_COMMUNITY)
Admission: RE | Admit: 2018-07-11 | Discharge: 2018-07-11 | Disposition: A | Discharge status: Home / Self Care | Payer: Self-pay | Source: Ambulatory Visit | Attending: Physician Assistant | Admitting: Physician Assistant

## 2018-07-11 DIAGNOSIS — E785 Hyperlipidemia, unspecified: Secondary | ICD-10-CM | POA: Insufficient documentation

## 2018-07-11 LAB — LIPID PANEL
Cholesterol: 287 mg/dL — ABNORMAL HIGH (ref 0–200)
HDL: 33 mg/dL — ABNORMAL LOW (ref >40)
LDL Cholesterol: 205 mg/dL — ABNORMAL HIGH (ref 0–99)
Total CHOL/HDL Ratio: 8.7 RATIO
Triglycerides: 245 mg/dL — ABNORMAL HIGH (ref <150)
VLDL: 49 mg/dL — ABNORMAL HIGH (ref 0–40)

## 2018-07-12 ENCOUNTER — Other Ambulatory Visit: Payer: Self-pay | Admitting: Physician Assistant

## 2018-07-12 DIAGNOSIS — Z1239 Encounter for other screening for malignant neoplasm of breast: Secondary | ICD-10-CM

## 2018-07-21 ENCOUNTER — Other Ambulatory Visit: Payer: Self-pay | Admitting: Physician Assistant

## 2018-07-21 DIAGNOSIS — E039 Hypothyroidism, unspecified: Secondary | ICD-10-CM

## 2018-07-21 DIAGNOSIS — I10 Essential (primary) hypertension: Secondary | ICD-10-CM

## 2018-07-21 DIAGNOSIS — E785 Hyperlipidemia, unspecified: Secondary | ICD-10-CM

## 2018-07-21 DIAGNOSIS — R7303 Prediabetes: Secondary | ICD-10-CM

## 2018-07-25 ENCOUNTER — Encounter: Payer: Self-pay | Admitting: Student

## 2018-07-29 ENCOUNTER — Ambulatory Visit (HOSPITAL_COMMUNITY): Admission: RE | Admit: 2018-07-29 | Payer: Self-pay | Source: Ambulatory Visit

## 2018-09-10 ENCOUNTER — Other Ambulatory Visit: Payer: Self-pay | Admitting: Physician Assistant

## 2018-10-16 ENCOUNTER — Ambulatory Visit: Payer: Self-pay | Admitting: Physician Assistant

## 2018-10-28 ENCOUNTER — Other Ambulatory Visit (HOSPITAL_COMMUNITY)
Admission: RE | Admit: 2018-10-28 | Discharge: 2018-10-28 | Disposition: A | Discharge status: Home / Self Care | Payer: Self-pay | Source: Ambulatory Visit | Attending: Physician Assistant | Admitting: Physician Assistant

## 2018-10-28 DIAGNOSIS — E785 Hyperlipidemia, unspecified: Secondary | ICD-10-CM | POA: Insufficient documentation

## 2018-10-28 DIAGNOSIS — R7303 Prediabetes: Secondary | ICD-10-CM | POA: Insufficient documentation

## 2018-10-28 DIAGNOSIS — E039 Hypothyroidism, unspecified: Secondary | ICD-10-CM | POA: Insufficient documentation

## 2018-10-28 DIAGNOSIS — I10 Essential (primary) hypertension: Secondary | ICD-10-CM | POA: Insufficient documentation

## 2018-10-28 LAB — COMPREHENSIVE METABOLIC PANEL
ALT: 20 U/L (ref 0–44)
AST: 19 U/L (ref 15–41)
Albumin: 4.3 g/dL (ref 3.5–5.0)
Alkaline Phosphatase: 70 U/L (ref 38–126)
Anion gap: 9 (ref 5–15)
BUN: 16 mg/dL (ref 8–23)
CO2: 24 mmol/L (ref 22–32)
Calcium: 9.6 mg/dL (ref 8.9–10.3)
Chloride: 104 mmol/L (ref 98–111)
Creatinine, Ser: 0.71 mg/dL (ref 0.44–1.00)
GFR calc Af Amer: >60 mL/min (ref >60)
GFR calc non Af Amer: >60 mL/min (ref >60)
Glucose, Bld: 115 mg/dL — ABNORMAL HIGH (ref 70–99)
Potassium: 4.1 mmol/L (ref 3.5–5.1)
Sodium: 137 mmol/L (ref 135–145)
Total Bilirubin: 0.4 mg/dL (ref 0.3–1.2)
Total Protein: 8.2 g/dL — ABNORMAL HIGH (ref 6.5–8.1)

## 2018-10-28 LAB — LIPID PANEL
Cholesterol: 284 mg/dL — ABNORMAL HIGH (ref 0–200)
HDL: 36 mg/dL — ABNORMAL LOW (ref >40)
LDL Cholesterol: 212 mg/dL — ABNORMAL HIGH (ref 0–99)
Total CHOL/HDL Ratio: 7.9 RATIO
Triglycerides: 180 mg/dL — ABNORMAL HIGH (ref <150)
VLDL: 36 mg/dL (ref 0–40)

## 2018-10-28 LAB — HEMOGLOBIN A1C
Hgb A1c MFr Bld: 6.3 % — ABNORMAL HIGH (ref 4.8–5.6)
Mean Plasma Glucose: 134.11 mg/dL

## 2018-10-28 LAB — TSH: TSH: 0.165 u[IU]/mL — ABNORMAL LOW (ref 0.350–4.500)

## 2018-11-05 ENCOUNTER — Ambulatory Visit: Payer: Self-pay | Admitting: Physician Assistant

## 2018-11-20 ENCOUNTER — Ambulatory Visit: Payer: Self-pay | Admitting: Physician Assistant

## 2018-11-20 ENCOUNTER — Encounter: Payer: Self-pay | Admitting: Physician Assistant

## 2018-11-20 VITALS — BP 136/81 | HR 78

## 2018-11-20 DIAGNOSIS — E669 Obesity, unspecified: Secondary | ICD-10-CM

## 2018-11-20 DIAGNOSIS — Z1239 Encounter for other screening for malignant neoplasm of breast: Secondary | ICD-10-CM

## 2018-11-20 DIAGNOSIS — E039 Hypothyroidism, unspecified: Secondary | ICD-10-CM

## 2018-11-20 DIAGNOSIS — F17200 Nicotine dependence, unspecified, uncomplicated: Secondary | ICD-10-CM

## 2018-11-20 DIAGNOSIS — R7303 Prediabetes: Secondary | ICD-10-CM

## 2018-11-20 DIAGNOSIS — E785 Hyperlipidemia, unspecified: Secondary | ICD-10-CM

## 2018-11-20 DIAGNOSIS — I10 Essential (primary) hypertension: Secondary | ICD-10-CM

## 2018-11-20 MED ORDER — LEVOTHYROXINE SODIUM 100 MCG PO TABS: 100.0000 ug | ORAL_TABLET | Freq: Every day | ORAL | 1 refills | Status: DC

## 2018-11-20 NOTE — Progress Notes (Signed)
BP 136/81   Pulse 78   LMP 07/16/2000 (Approximate)    Subjective:    Patient ID: Alyssa [name], female    DOB: June 02, 1955, 63 y.o.   MRN: [id number]  HPI: [name] is a 63 y.o. female presenting on 11/20/2018 for No chief complaint on file.   HPI   This is a telemedicine appointment through [employer] due to coronavirus pandemic.  I connected with  Aleta Manternach Hunsinger on 11/20/18 by a video enabled telemedicine application and verified that I am speaking with the correct person using two identifiers.   I discussed the limitations of evaluation and management by telemedicine. The patient expressed understanding and agreed to proceed.  Patient is at home.  Provider is at office.    Patient is currently not working.  Patient recently moved to [place] which is in [place].  Patient says she is doing well and she has no complaints today.  Patient continues to smoke.  She says that she is not exercising regularly at this time.       Relevant past medical, surgical, family and social history reviewed and updated as indicated. Interim medical history since our last visit reviewed. Allergies and medications reviewed and updated.   Current Outpatient Medications:  .  aspirin 81 MG tablet, Take 81 mg by mouth daily., Disp: , Rfl:  .  atorvastatin (LIPITOR) 80 MG tablet, TAKE 1 Tablet BY MOUTH ONCE DAILY, Disp: 90 tablet, Rfl: 1 .  lisinopril-hydrochlorothiazide (ZESTORETIC) 20-12.5 MG tablet, TAKE 1 Tablet BY MOUTH ONCE DAILY, Disp: 90 tablet, Rfl: 2 .  omeprazole (PRILOSEC) 40 MG capsule, TAKE 1 Capsule BY MOUTH ONCE DAILY, Disp: 90 capsule, Rfl: 1 .  PROVENTIL HFA 108 (90 Base) MCG/ACT inhaler, INHALE 2 PUFFS BY MOUTH EVERY 6 HOURS AS NEEDED FOR COUGHING, WHEEZING, OR SHORTNESS OF BREATH, Disp: 20.1 g, Rfl: 0 .  SYNTHROID 112 MCG tablet, TAKE 1 Tablet BY MOUTH ONCE DAILY, Disp: 90 tablet, Rfl: 1    Review of Systems  Per HPI unless specifically indicated above      Objective:    BP 136/81   Pulse 78   LMP 07/16/2000 (Approximate)   Wt Readings from Last 3 Encounters:  01/10/18 219 lb 8 oz (99.6 kg)  10/11/17 216 lb 12 oz (98.3 kg)  07/12/17 207 lb (93.9 kg)    Physical Exam Constitutional:      General: She is not in acute distress.    Appearance: She is obese. She is not ill-appearing.  HENT:     Head: Normocephalic and atraumatic.  Pulmonary:     Effort: No respiratory distress.  Neurological:     Mental Status: She is alert and oriented to person, place, and time.  Psychiatric:        Attention and Perception: Attention normal.        Speech: Speech normal.        Behavior: Behavior is cooperative.     Results for orders placed or performed during the hospital encounter of 10/28/18  Comprehensive metabolic panel  Result Value Ref Range   Sodium 137 135 - 145 mmol/L   Potassium 4.1 3.5 - 5.1 mmol/L   Chloride 104 98 - 111 mmol/L   CO2 24 22 - 32 mmol/L   Glucose, Bld 115 (H) 70 - 99 mg/dL   BUN 16 8 - 23 mg/dL   Creatinine, Ser 0.71 0.44 - 1.00 mg/dL   Calcium 9.6 8.9 - 10.3 mg/dL  Total Protein 8.2 (H) 6.5 - 8.1 g/dL   Albumin 4.3 3.5 - 5.0 g/dL   AST 19 15 - 41 U/L   ALT 20 0 - 44 U/L   Alkaline Phosphatase 70 38 - 126 U/L   Total Bilirubin 0.4 0.3 - 1.2 mg/dL   GFR calc non Af Amer >60 >60 mL/min   GFR calc Af Amer >60 >60 mL/min   Anion gap 9 5 - 15  TSH  Result Value Ref Range   TSH 0.165 (L) 0.350 - 4.500 uIU/mL  Lipid panel  Result Value Ref Range   Cholesterol 284 (H) 0 - 200 mg/dL   Triglycerides 026 (H) <150 mg/dL   HDL 36 (L) >37 mg/dL   Total CHOL/HDL Ratio 7.9 RATIO   VLDL 36 0 - 40 mg/dL   LDL Cholesterol 858 (H) 0 - 99 mg/dL  Hemoglobin I5O  Result Value Ref Range   Hgb A1c MFr Bld 6.3 (H) 4.8 - 5.6 %   Mean Plasma Glucose 134.11 mg/dL      Assessment & Plan:    Encounter Diagnoses  Name Primary?  . Essential hypertension, benign Yes  . Hypothyroidism, unspecified type   .  Hyperlipidemia, unspecified hyperlipidemia type   . Prediabetes   . Encounter for screening for malignant neoplasm of breast, unspecified screening modality   . Tobacco use disorder   . Obesity, unspecified classification, unspecified obesity type, unspecified whether serious comorbidity present      Labs were reviewed with patient.    Her Synthroid will be cut back to 100 mcg.   Discussed with pt that she neds to contact [facility] TODAY if she she has not updated her address with them.    Discussed that she is past due with needing a screening mammogram but will defer ordering this as patient no longer qualifies to be a patient at the free clinic [facility] now that she no longer lives in [place].  Patient states understanding.  Discussed proper technique on using a wrist monitor to check her BP.  She is to monitor it once/week for good moniotring and also to improve her technique  Encouraged patient to stop smoking.  Encouraged patient to participate in regular exercise, recommended walking in particular.  Patient is encouraged to wear a mask when she goes out in public to reduce risk for contracting COVID-19.  Patient is encouraged to establish care with new primary care in [facility].

## 2018-12-10 ENCOUNTER — Other Ambulatory Visit: Payer: Self-pay | Admitting: Physician Assistant

## 2019-07-24 ENCOUNTER — Other Ambulatory Visit: Payer: Self-pay | Admitting: Physician Assistant

## 2022-06-07 DIAGNOSIS — E119 Type 2 diabetes mellitus without complications: Secondary | ICD-10-CM | POA: Insufficient documentation

## 2022-06-08 ENCOUNTER — Ambulatory Visit: Payer: Medicare Other | Admitting: Cardiology

## 2022-08-01 LAB — LAB REPORT - SCANNED
A1c: 5.6
EGFR: 59.8

## 2022-09-14 ENCOUNTER — Other Ambulatory Visit: Payer: Self-pay

## 2022-09-14 DIAGNOSIS — Z6833 Body mass index (BMI) 33.0-33.9, adult: Secondary | ICD-10-CM | POA: Insufficient documentation

## 2022-09-14 DIAGNOSIS — I700 Atherosclerosis of aorta: Secondary | ICD-10-CM | POA: Insufficient documentation

## 2022-09-14 DIAGNOSIS — R41840 Attention and concentration deficit: Secondary | ICD-10-CM | POA: Insufficient documentation

## 2022-09-14 DIAGNOSIS — R4181 Age-related cognitive decline: Secondary | ICD-10-CM | POA: Insufficient documentation

## 2022-09-14 DIAGNOSIS — F419 Anxiety disorder, unspecified: Secondary | ICD-10-CM | POA: Insufficient documentation

## 2022-09-14 DIAGNOSIS — Z6834 Body mass index (BMI) 34.0-34.9, adult: Secondary | ICD-10-CM | POA: Insufficient documentation

## 2022-09-14 DIAGNOSIS — I10 Essential (primary) hypertension: Secondary | ICD-10-CM | POA: Insufficient documentation

## 2022-09-14 DIAGNOSIS — K219 Gastro-esophageal reflux disease without esophagitis: Secondary | ICD-10-CM | POA: Insufficient documentation

## 2022-09-14 DIAGNOSIS — E039 Hypothyroidism, unspecified: Secondary | ICD-10-CM | POA: Insufficient documentation

## 2022-09-14 DIAGNOSIS — F17210 Nicotine dependence, cigarettes, uncomplicated: Secondary | ICD-10-CM | POA: Insufficient documentation

## 2022-09-14 DIAGNOSIS — E559 Vitamin D deficiency, unspecified: Secondary | ICD-10-CM | POA: Insufficient documentation

## 2022-09-14 DIAGNOSIS — E079 Disorder of thyroid, unspecified: Secondary | ICD-10-CM | POA: Insufficient documentation

## 2022-09-19 ENCOUNTER — Ambulatory Visit: Payer: Medicare Other | Attending: Cardiology | Admitting: Cardiology

## 2022-09-19 ENCOUNTER — Telehealth (HOSPITAL_COMMUNITY): Payer: Self-pay | Admitting: *Deleted

## 2022-09-19 ENCOUNTER — Encounter: Payer: Self-pay | Admitting: Cardiology

## 2022-09-19 VITALS — BP 140/92 | HR 60 | Ht 63.0 in | Wt 185.6 lb

## 2022-09-19 DIAGNOSIS — I2510 Atherosclerotic heart disease of native coronary artery without angina pectoris: Secondary | ICD-10-CM | POA: Insufficient documentation

## 2022-09-19 DIAGNOSIS — R0609 Other forms of dyspnea: Secondary | ICD-10-CM | POA: Insufficient documentation

## 2022-09-19 DIAGNOSIS — F17210 Nicotine dependence, cigarettes, uncomplicated: Secondary | ICD-10-CM | POA: Insufficient documentation

## 2022-09-19 DIAGNOSIS — E119 Type 2 diabetes mellitus without complications: Secondary | ICD-10-CM | POA: Insufficient documentation

## 2022-09-19 DIAGNOSIS — Z6833 Body mass index (BMI) 33.0-33.9, adult: Secondary | ICD-10-CM | POA: Diagnosis present

## 2022-09-19 DIAGNOSIS — I10 Essential (primary) hypertension: Secondary | ICD-10-CM | POA: Diagnosis present

## 2022-09-19 MED ORDER — ASPIRIN 81 MG PO TBEC: 81.0000 mg | DELAYED_RELEASE_TABLET | Freq: Every day | ORAL | Status: AC

## 2022-09-19 NOTE — Progress Notes (Signed)
Cardiology Office Note:    Date:  09/19/2022   ID:  Alyssa [name], DOB [birth year], MRN [id number]  PCP:  Erskine Emery, NP  Cardiologist:  Garwin Brothers, MD   Referring MD: Erskine Emery, NP    ASSESSMENT:    1. Essential hypertension, benign   2. Coronary artery calcification seen on CT scan   3. Essential (primary) hypertension   4. Diabetes mellitus without complication (HCC)   5. BMI 33.0-33.9,adult   6. Nicotine dependence, cigarettes, uncomplicated    PLAN:    In order of problems listed above:  Primary prevention stressed with the patient.  Importance of compliance with diet medication stressed and patient verbalized standing. Coronary artery calcification and atherosclerosis: Dyspnea on exertion: I discussed this with the patient.  Will certainly evaluate her for ischemic substrate.  She is agreeable.  She was advised to take a coated aspirin on a daily basis. Essential hypertension: Blood pressure stable and diet was emphasized.  She mentions to me that her blood pressures are better at home and she keeps a track of them.  Will continue to monitor this. Mixed dyslipidemia: Lipids are much better on statin therapy we have markedly elevated at 1 point. Cardiac murmur: Echocardiogram will be done to assess murmur heard on auscultation. Obesity: Weight reduction stressed diet emphasized.  She promises to do better. Cigarette smoker: I spent 5 minutes with the patient discussing solely about smoking. Smoking cessation was counseled. I suggested to the patient also different medications and pharmacological interventions. Patient is keen to try stopping on its own at this time. He will get back to me if he needs any further assistance in this matter. Patient will be seen in follow-up appointment in 6 months or earlier if the patient has any concerns.    Medication Adjustments/Labs and Tests Ordered: Current medicines are reviewed at length with the patient today.  Concerns  regarding medicines are outlined above.  Orders Placed This Encounter  Procedures   EKG 12-Lead   No orders of the defined types were placed in this encounter.    History of Present Illness:    Alyssa [name] is a 67 y.o. female who is being seen today for the evaluation of dyspnea on exertion and coronary artery calcification seen on CT scan at the request of Erskine Emery, NP.  Patient is a pleasant 67 year old female accompanied by her daughter.  She has past medical history of essential hypertension dyslipidemia, diabetes mellitus and obesity.  She leads a sedentary lifestyle.  Unfortunately she smokes heavily for the past several years.  She denies any chest pain orthopnea or PND.  At the time of my evaluation, the patient is alert awake oriented and in no distress.  Past Medical History:  Diagnosis Date   Age-related cognitive decline    Anxiety disorder    Aortic atherosclerosis (HCC)    Attention and concentration deficit    BMI 33.0-33.9,adult    BMI 34.0-34.9,adult    Cigarette nicotine dependence, uncomplicated 11/29/2014   Diabetes mellitus without complication (HCC)    Dysphagia, idiopathic 02/01/2017   Esophageal reflux 05/15/2015   Essential (primary) hypertension    Essential hypertension, benign 11/29/2014   GERD without esophagitis    Hyperlipidemia    Hypertension    Hypothyroidism    Morbid obesity (HCC) 11/29/2014   Nicotine dependence, cigarettes, uncomplicated    Thyroid activity decreased 01/12/2015   Thyroid disease    Vitamin D deficiency  Past Surgical History:  Procedure Laterality Date   CARPAL TUNNEL RELEASE     ESOPHAGOGASTRODUODENOSCOPY N/A 03/02/2017   Procedure: ESOPHAGOGASTRODUODENOSCOPY (EGD);  Surgeon: West Bali, MD;  Location: [facility];  Service: Endoscopy;  Laterality: N/A;  1:00pm   SAVORY DILATION N/A 03/02/2017   Procedure: SAVORY DILATION;  Surgeon: West Bali, MD;  Location: [facility];  Service: Endoscopy;   Laterality: N/A;   TUBAL LIGATION      Current Medications: Current Meds  Medication Sig   alendronate (FOSAMAX) 70 MG tablet Take 70 mg by mouth once a week.   atorvastatin (LIPITOR) 20 MG tablet Take 20 mg by mouth daily.   cetirizine (ZYRTEC) 10 MG tablet Take 10 mg by mouth at bedtime.   Cholecalciferol (VITAMIN D3) 1.25 MG (50000 UT) CAPS Take 1 capsule by mouth once a week.   escitalopram (LEXAPRO) 10 MG tablet Take 10 mg by mouth daily.   levothyroxine (SYNTHROID) 88 MCG tablet Take 88 mcg by mouth every morning.   memantine (NAMENDA) 10 MG tablet Take 10 mg by mouth 2 (two) times daily.   olmesartan (BENICAR) 5 MG tablet Take 5 mg by mouth daily.   omeprazole (PRILOSEC) 40 MG capsule TAKE 1 Capsule BY MOUTH ONCE DAILY   PROVENTIL HFA 108 (90 Base) MCG/ACT inhaler INHALE 2 PUFFS BY MOUTH EVERY 6 HOURS AS NEEDED FOR COUGHING, WHEEZING, OR SHORTNESS OF BREATH     Allergies:   Sulfa antibiotics and Latex   Social History   Socioeconomic History   Marital status: Widowed    Spouse name: Not on file   Number of children: Not on file   Years of education: Not on file   Highest education level: Not on file  Occupational History   Not on file  Tobacco Use   Smoking status: Every Day    Current packs/day: 0.50    Average packs/day: 0.5 packs/day for 42.0 years (21.0 ttl pk-yrs)    Types: Cigarettes   Smokeless tobacco: Never  Substance and Sexual Activity   Alcohol use: No   Drug use: No   Sexual activity: Yes  Other Topics Concern   Not on file  Social History Narrative   WORKS AT [employer] PART TIME. DRAWS A WIDOWS PENSION. HUSBAND DIED IN 2005/12/18 AND DAUGHTER WAS 9 YO. KIDS: TWO(ONE: 42, ONE: 75)   Social Determinants of Corporate investment banker Strain: Not on file  Food Insecurity: Not on file  Transportation Needs: Not on file  Physical Activity: Not on file  Stress: Not on file  Social Connections: Not on file     Family History: The patient's family  history includes Alcohol abuse in her father; Alzheimer's disease in her mother. There is no history of Colon cancer or Colon polyps.  ROS:   Please see the history of present illness.    All other systems reviewed and are negative.  EKGs/Labs/Other Studies Reviewed:    The following studies were reviewed today:  EKG Interpretation Date/Time:  Tuesday September 19 2022 09:51:36 EDT Ventricular Rate:  60 PR Interval:  170 QRS Duration:  92 QT Interval:  460 QTC Calculation: 460 R Axis:   -42  Text Interpretation: Sinus rhythm with Premature atrial complexes Left axis deviation Possible Lateral infarct , age undetermined Abnormal ECG When compared with ECG of 21-Sep-2006 22:37, Premature atrial complexes are now Present Inverted T waves have replaced nonspecific T wave abnormality in Inferior leads Nonspecific T wave abnormality now evident in  Anterior leads QT has shortened Confirmed by Belva Crome [phone]) on 09/19/2022 10:10:12 AM     Recent Labs: No results found for requested labs within last 365 days.  Recent Lipid Panel    Component Value Date/Time   CHOL 284 (H) 10/28/2018 1312   TRIG 180 (H) 10/28/2018 1312   HDL 36 (L) 10/28/2018 1312   CHOLHDL 7.9 10/28/2018 1312   VLDL 36 10/28/2018 1312   LDLCALC 212 (H) 10/28/2018 1312    Physical Exam:    VS:  BP (!) 150/96   Pulse 60   Ht 5\' 3"  (1.6 m)   Wt 185 lb 9.6 oz (84.2 kg)   LMP 07/16/2000 (Approximate)   SpO2 95%   BMI 32.88 kg/m     Wt Readings from Last 3 Encounters:  09/19/22 185 lb 9.6 oz (84.2 kg)  01/10/18 219 lb 8 oz (99.6 kg)  10/11/17 216 lb 12 oz (98.3 kg)     GEN: Patient is in no acute distress HEENT: Normal NECK: No JVD; No carotid bruits LYMPHATICS: No lymphadenopathy CARDIAC: S1 S2 regular, 2/6 systolic murmur at the apex. RESPIRATORY:  Clear to auscultation without rales, wheezing or rhonchi  ABDOMEN: Soft, non-tender, non-distended MUSCULOSKELETAL:  No edema; No deformity  SKIN:  Warm and dry NEUROLOGIC:  Alert and oriented x 3 PSYCHIATRIC:  Normal affect    Signed, Garwin Brothers, MD  09/19/2022 10:11 AM    [facility]

## 2022-09-19 NOTE — Patient Instructions (Signed)
Medication Instructions:  Your physician has recommended you make the following change in your medication:   Start taking 81 mg coated aspirin daily.  *If you need a refill on your cardiac medications before your next appointment, please call your pharmacy*   Lab Work: None ordered If you have labs (blood work) drawn today and your tests are completely normal, you will receive your results only by: MyChart Message (if you have MyChart) OR A paper copy in the mail If you have any lab test that is abnormal or we need to change your treatment, we will call you to review the results.   Testing/Procedures: You are scheduled for a Myocardial Perfusion Imaging Study.  Please arrive 15 minutes prior to your appointment time for registration and insurance purposes.  The test will take approximately 3 to 4 hours to complete; you may bring reading material.  If someone comes with you to your appointment, they will need to remain in the main lobby due to limited space in the testing area.   How to prepare for your Myocardial Perfusion Test: Do not eat or drink 3 hours prior to your test, except you may have water. Do not consume products containing caffeine (regular or decaffeinated) 12 hours prior to your test. (ex: coffee, chocolate, sodas, tea). Do bring a list of your current medications with you.  If not listed below, you may take your medications as normal. Do wear comfortable clothes (no dresses or overalls) and walking shoes, tennis shoes preferred (No heels or open toe shoes are allowed). Do NOT wear cologne, perfume, aftershave, or lotions (deodorant is allowed). If these instructions are not followed, your test will have to be rescheduled.  If you cannot keep your appointment, please provide 24 hours notification to the Nuclear Lab, to avoid a possible $50 charge to your account.   Your physician has requested that you have an echocardiogram. Echocardiography is a painless test that  uses sound waves to create images of your heart. It provides your doctor with information about the size and shape of your heart and how well your heart's chambers and valves are working. This procedure takes approximately one hour. There are no restrictions for this procedure. Please do NOT wear cologne, perfume, aftershave, or lotions (deodorant is allowed). Please arrive 15 minutes prior to your appointment time.   Follow-Up: At [facility], you and your health needs are our priority.  As part of our continuing mission to provide you with exceptional heart care, we have created designated Provider Care Teams.  These Care Teams include your primary Cardiologist (physician) and Advanced Practice Providers (APPs -  Physician Assistants and Nurse Practitioners) who all work together to provide you with the care you need, when you need it.  We recommend signing up for the patient portal called "MyChart".  Sign up information is provided on this After Visit Summary.  MyChart is used to connect with patients for Virtual Visits (Telemedicine).  Patients are able to view lab/test results, encounter notes, upcoming appointments, etc.  Non-urgent messages can be sent to your provider as well.   To learn more about what you can do with MyChart, go to [email or web address].    Your next appointment:   9 month(s)  Provider:   Belva Crome, MD   Other Instructions  Cardiac Nuclear Scan A cardiac nuclear scan is a test that is done to check the flow of blood to your heart. It is done when you are resting and when you  are exercising. The test looks for problems such as: Not enough blood reaching a portion of the heart. The heart muscle not working as it should. You may need this test if you have: Heart disease. Lab results that are not normal. Had heart surgery or a balloon procedure to open up blocked arteries (angioplasty) or a small mesh tube (stent). Chest pain. Shortness of  breath. Had a heart attack. In this test, a special dye (tracer) is put into your bloodstream. The tracer will travel to your heart. A camera will then take pictures of your heart to see how the tracer moves through your heart. This test is usually done at a hospital and takes 2-4 hours. Tell a doctor about: Any allergies you have. All medicines you are taking, including vitamins, herbs, eye drops, creams, and over-the-counter medicines. Any bleeding problems you have. Any surgeries you have had. Any medical conditions you have. Whether you are pregnant or may be pregnant. Any history of asthma or long-term (chronic) lung disease. Any history of heart rhythm disorders or heart valve conditions. What are the risks? Your doctor will talk with you about risks. These may include: Serious chest pain and heart attack. This is only a risk if the stress portion of the test is done. Fast or uneven heartbeats (palpitations). A feeling of warmth in your chest. This feeling usually does not last long. Allergic reaction to the tracer. Shortness of breath or trouble breathing. What happens before the test? Ask your doctor about changing or stopping your normal medicines. Follow instructions from your doctor about what you cannot eat or drink. Remove your jewelry on the day of the test. Ask your doctor if you need to avoid nicotine or caffeine. What happens during the test? An IV tube will be inserted into one of your veins. Your doctor will give you a small amount of tracer through the IV tube. You will wait for 20-40 minutes while the tracer moves through your bloodstream. Your heart will be monitored with an electrocardiogram (ECG). You will lie down on an exam table. Pictures of your heart will be taken for about 15-20 minutes. You may also have a stress test. For this test, one of these things may be done: You will be asked to exercise on a treadmill or a stationary bike. You will be given  medicines that will make your heart work harder. This is done if you are unable to exercise. When blood flow to your heart has peaked, a tracer will again be given through the IV tube. After 20-40 minutes, you will get back on the exam table. More pictures will be taken of your heart. Depending on the tracer that is used, more pictures may need to be taken 3-4 hours later. Your IV tube will be removed when the test is over. The test may vary among doctors and hospitals. What happens after the test? Ask your doctor: Whether you can return to your normal schedule, including diet, activities, travel, and medicines. Whether you should drink more fluids. This will help to remove the tracer from your body. Ask your doctor, or the department that is doing the test: When will my results be ready? How will I get my results? What are my treatment options? What other tests do I need? What are my next steps? This information is not intended to replace advice given to you by your health care provider. Make sure you discuss any questions you have with your health care provider. Document Revised:  05/17/2021 Document Reviewed: 05/17/2021 Elsevier Patient Education  2023 Elsevier Inc.  Echocardiogram An echocardiogram is a test that uses sound waves (ultrasound) to produce images of the heart. Images from an echocardiogram can provide important information about: Heart size and shape. The size and thickness and movement of your heart's walls. Heart muscle function and strength. Heart valve function or if you have stenosis. Stenosis is when the heart valves are too narrow. If blood is flowing backward through the heart valves (regurgitation). A tumor or infectious growth around the heart valves. Areas of heart muscle that are not working well because of poor blood flow or injury from a heart attack. Aneurysm detection. An aneurysm is a weak or damaged part of an artery wall. The wall bulges out from the  normal force of blood pumping through the body. Tell a health care provider about: Any allergies you have. All medicines you are taking, including vitamins, herbs, eye drops, creams, and over-the-counter medicines. Any blood disorders you have. Any surgeries you have had. Any medical conditions you have. Whether you are pregnant or may be pregnant. What are the risks? Generally, this is a safe test. However, problems may occur, including an allergic reaction to dye (contrast) that may be used during the test. What happens before the test? No specific preparation is needed. You may eat and drink normally. What happens during the test?  You will take off your clothes from the waist up and put on a hospital gown. Electrodes or electrocardiogram (ECG)patches may be placed on your chest. The electrodes or patches are then connected to a device that monitors your heart rate and rhythm. You will lie down on a table for an ultrasound exam. A gel will be applied to your chest to help sound waves pass through your skin. A handheld device, called a transducer, will be pressed against your chest and moved over your heart. The transducer produces sound waves that travel to your heart and bounce back (or "echo" back) to the transducer. These sound waves will be captured in real-time and changed into images of your heart that can be viewed on a video monitor. The images will be recorded on a computer and reviewed by your health care provider. You may be asked to change positions or hold your breath for a short time. This makes it easier to get different views or better views of your heart. In some cases, you may receive contrast through an IV in one of your veins. This can improve the quality of the pictures from your heart. The procedure may vary among health care providers and hospitals. What can I expect after the test? You may return to your normal, everyday life, including diet, activities, and  medicines, unless your health care provider tells you not to do that. Follow these instructions at home: It is up to you to get the results of your test. Ask your health care provider, or the department that is doing the test, when your results will be ready. Keep all follow-up visits. This is important. Summary An echocardiogram is a test that uses sound waves (ultrasound) to produce images of the heart. Images from an echocardiogram can provide important information about the size and shape of your heart, heart muscle function, heart valve function, and other possible heart problems. You do not need to do anything to prepare before this test. You may eat and drink normally. After the echocardiogram is completed, you may return to your normal, everyday life, unless your  health care provider tells you not to do that. This information is not intended to replace advice given to you by your health care provider. Make sure you discuss any questions you have with your health care provider. Document Revised: 09/01/2020 Document Reviewed: 08/12/2019 Elsevier Patient Education  2023 [employer].

## 2022-09-19 NOTE — Telephone Encounter (Signed)
Left message on voicemail per DPR in reference to upcoming appointment scheduled on 09/26/22 with detailed instructions given per Myocardial Perfusion Study Information Sheet for the test. LM to arrive 15 minutes early, and that it is imperative to arrive on time for appointment to keep from having the test rescheduled. If you need to cancel or reschedule your appointment, please call the office within 24 hours of your appointment. Failure to do so may result in a cancellation of your appointment, and a $50 no show fee. Phone number given for call back for any questions. Ricky Ala

## 2022-09-26 ENCOUNTER — Ambulatory Visit: Payer: Medicare Other | Attending: Cardiology

## 2022-09-26 DIAGNOSIS — R0609 Other forms of dyspnea: Secondary | ICD-10-CM | POA: Diagnosis present

## 2022-09-26 MED ORDER — TECHNETIUM TC 99M TETROFOSMIN IV KIT
30.8000 | PACK | Freq: Once | INTRAVENOUS | Status: AC | PRN
  Administered 2022-09-26: 30.8 via INTRAVENOUS

## 2022-09-26 MED ORDER — REGADENOSON 0.4 MG/5ML IV SOLN
0.4000 mg | Freq: Once | INTRAVENOUS | Status: AC
Start: 2022-09-26 — End: 2022-09-26
  Administered 2022-09-26: 0.4 mg via INTRAVENOUS

## 2022-09-26 MED ORDER — TECHNETIUM TC 99M TETROFOSMIN IV KIT
10.5000 | PACK | Freq: Once | INTRAVENOUS | Status: AC | PRN
  Administered 2022-09-26: 10.5 via INTRAVENOUS

## 2022-09-27 LAB — MYOCARDIAL PERFUSION IMAGING
LV dias vol: 65 mL (ref 46–106)
LV sys vol: 18 mL
Nuc Stress EF: 72 %
Peak HR: 88 {beats}/min
Rest HR: 46 {beats}/min
Rest Nuclear Isotope Dose: 10.5 mCi
SDS: 2
SRS: 4
SSS: 6
ST Depression (mm): 0 mm
Stress Nuclear Isotope Dose: 30.8 mCi
TID: 0.84

## 2022-09-29 ENCOUNTER — Telehealth: Payer: Self-pay

## 2022-09-29 NOTE — Telephone Encounter (Signed)
Left vm to return call regarding results.

## 2022-09-29 NOTE — Telephone Encounter (Signed)
-----   Message from Aundra Dubin Revankar sent at 09/28/2022  3:01 PM EDT ----- The results of the study is unremarkable. Please inform patient. I will discuss in detail at next appointment. Cc  primary care/referring physician Garwin Brothers, MD 09/28/2022 3:01 PM

## 2022-10-25 ENCOUNTER — Ambulatory Visit: Payer: Medicare Other | Attending: Cardiology

## 2022-10-25 DIAGNOSIS — R0609 Other forms of dyspnea: Secondary | ICD-10-CM | POA: Diagnosis present

## 2022-10-25 LAB — ECHOCARDIOGRAM COMPLETE
Area-P 1/2: 2.61 cm2
MV M vel: 2.28 m/s
MV Peak grad: 20.8 mm[Hg]
S' Lateral: 3.2 cm

## 2022-10-31 ENCOUNTER — Telehealth: Payer: Self-pay

## 2022-10-31 NOTE — Telephone Encounter (Signed)
-----   Message from Aundra Dubin Revankar sent at 10/26/2022  7:52 AM EDT ----- The results of the study is unremarkable. Please inform patient. I will discuss in detail at next appointment. Cc  primary care/referring physician Garwin Brothers, MD 10/26/2022 7:51 AM

## 2022-10-31 NOTE — Telephone Encounter (Signed)
Left message for patient to call back  

## 2022-12-12 ENCOUNTER — Other Ambulatory Visit: Payer: Self-pay | Admitting: Family

## 2022-12-12 DIAGNOSIS — Z1231 Encounter for screening mammogram for malignant neoplasm of breast: Secondary | ICD-10-CM

## 2022-12-15 ENCOUNTER — Inpatient Hospital Stay: Admission: RE | Admit: 2022-12-15 | Payer: Medicare Other | Source: Ambulatory Visit

## 2022-12-25 ENCOUNTER — Encounter: Payer: Self-pay | Admitting: Internal Medicine

## 2023-01-26 ENCOUNTER — Ambulatory Visit
Admission: RE | Admit: 2023-01-26 | Discharge: 2023-01-26 | Disposition: A | Discharge status: Home / Self Care | Payer: Medicare Other | Source: Ambulatory Visit | Attending: Family | Admitting: Family

## 2023-01-26 DIAGNOSIS — Z1231 Encounter for screening mammogram for malignant neoplasm of breast: Secondary | ICD-10-CM

## 2023-03-13 ENCOUNTER — Ambulatory Visit: Payer: Medicare Other | Admitting: Internal Medicine

## 2023-11-28 NOTE — Telephone Encounter (Signed)
 Pt has surgery for Closed displaced fracture of right femoral neck on 11/15 with Dr. Germaine per surgery notes pt needs to be seen in 10 days for post op visit ,  wants to be seen at [facility] [employer] office, no openings with Dr. Germaine or PA until 12/04, needs sooner appt   Pt is in rehab facility, [facility] and [place], Mrs. Dane at [phone] can be contacted to schedule appt

## 2023-11-28 NOTE — Telephone Encounter (Signed)
 It is not urgent to see within 10 days. I can see 12/1 in [place] or 12/9 at [facility]. Either are fine. Thanks!
